# Patient Record
Sex: Female | Born: 1935 | Race: Black or African American | Hispanic: No | Marital: Married | Smoking: Never smoker
Health system: Southern US, Community
[De-identification: ages and names within clinical notes are randomized; demographics above are authoritative.]

## PROBLEM LIST (undated history)

## (undated) DIAGNOSIS — R748 Abnormal levels of other serum enzymes: Secondary | ICD-10-CM

## (undated) DIAGNOSIS — E739 Lactose intolerance, unspecified: Secondary | ICD-10-CM

## (undated) DIAGNOSIS — N6459 Other signs and symptoms in breast: Secondary | ICD-10-CM

## (undated) DIAGNOSIS — I340 Nonrheumatic mitral (valve) insufficiency: Secondary | ICD-10-CM

## (undated) DIAGNOSIS — I10 Essential (primary) hypertension: Secondary | ICD-10-CM

## (undated) DIAGNOSIS — E119 Type 2 diabetes mellitus without complications: Secondary | ICD-10-CM

## (undated) DIAGNOSIS — M1990 Unspecified osteoarthritis, unspecified site: Secondary | ICD-10-CM

## (undated) DIAGNOSIS — I5189 Other ill-defined heart diseases: Secondary | ICD-10-CM

## (undated) DIAGNOSIS — K2970 Gastritis, unspecified, without bleeding: Secondary | ICD-10-CM

## (undated) DIAGNOSIS — R002 Palpitations: Secondary | ICD-10-CM

## (undated) DIAGNOSIS — R252 Cramp and spasm: Secondary | ICD-10-CM

## (undated) DIAGNOSIS — R0609 Other forms of dyspnea: Secondary | ICD-10-CM

## (undated) DIAGNOSIS — K5790 Diverticulosis of intestine, part unspecified, without perforation or abscess without bleeding: Secondary | ICD-10-CM

## (undated) DIAGNOSIS — R0789 Other chest pain: Secondary | ICD-10-CM

## (undated) DIAGNOSIS — E785 Hyperlipidemia, unspecified: Secondary | ICD-10-CM

## (undated) DIAGNOSIS — E7800 Pure hypercholesterolemia, unspecified: Secondary | ICD-10-CM

## (undated) DIAGNOSIS — N952 Postmenopausal atrophic vaginitis: Secondary | ICD-10-CM

## (undated) DIAGNOSIS — D573 Sickle-cell trait: Secondary | ICD-10-CM

## (undated) DIAGNOSIS — M810 Age-related osteoporosis without current pathological fracture: Secondary | ICD-10-CM

## (undated) DIAGNOSIS — K760 Fatty (change of) liver, not elsewhere classified: Secondary | ICD-10-CM

## (undated) DIAGNOSIS — M8580 Other specified disorders of bone density and structure, unspecified site: Secondary | ICD-10-CM

## (undated) DIAGNOSIS — I493 Ventricular premature depolarization: Secondary | ICD-10-CM

## (undated) DIAGNOSIS — IMO0002 Reserved for concepts with insufficient information to code with codable children: Secondary | ICD-10-CM

## (undated) DIAGNOSIS — E559 Vitamin D deficiency, unspecified: Secondary | ICD-10-CM

## (undated) DIAGNOSIS — I471 Supraventricular tachycardia: Secondary | ICD-10-CM

## (undated) DIAGNOSIS — I872 Venous insufficiency (chronic) (peripheral): Secondary | ICD-10-CM

## (undated) DIAGNOSIS — R0602 Shortness of breath: Secondary | ICD-10-CM

## (undated) DIAGNOSIS — R001 Bradycardia, unspecified: Secondary | ICD-10-CM

## (undated) DIAGNOSIS — R42 Dizziness and giddiness: Secondary | ICD-10-CM

## (undated) HISTORY — DX: Bradycardia, unspecified: R00.1

## (undated) HISTORY — DX: Unspecified osteoarthritis, unspecified site: M19.90

## (undated) HISTORY — DX: Lactose intolerance, unspecified: E73.9

## (undated) HISTORY — DX: Nonrheumatic mitral (valve) insufficiency: I34.0

## (undated) HISTORY — DX: Palpitations: R00.2

## (undated) HISTORY — DX: Other forms of dyspnea: R06.09

## (undated) HISTORY — DX: Other signs and symptoms in breast: N64.59

## (undated) HISTORY — DX: Essential (primary) hypertension: I10

## (undated) HISTORY — DX: Age-related osteoporosis without current pathological fracture: M81.0

## (undated) HISTORY — DX: Pure hypercholesterolemia, unspecified: E78.00

## (undated) HISTORY — DX: Sickle-cell trait: D57.3

## (undated) HISTORY — DX: Other ill-defined heart diseases: I51.89

## (undated) HISTORY — PX: COLONOSCOPY: SHX174

## (undated) HISTORY — DX: Shortness of breath: R06.02

## (undated) HISTORY — DX: Diverticulosis of intestine, part unspecified, without perforation or abscess without bleeding: K57.90

## (undated) HISTORY — DX: Hyperlipidemia, unspecified: E78.5

## (undated) HISTORY — DX: Dizziness and giddiness: R42

## (undated) HISTORY — DX: Fatty (change of) liver, not elsewhere classified: K76.0

## (undated) HISTORY — DX: Venous insufficiency (chronic) (peripheral): I87.2

## (undated) HISTORY — DX: Abnormal levels of other serum enzymes: R74.8

## (undated) HISTORY — DX: Supraventricular tachycardia: I47.1

## (undated) HISTORY — DX: Other chest pain: R07.89

## (undated) HISTORY — DX: Reserved for concepts with insufficient information to code with codable children: IMO0002

## (undated) HISTORY — DX: Type 2 diabetes mellitus without complications: E11.9

## (undated) HISTORY — DX: Vitamin D deficiency, unspecified: E55.9

## (undated) HISTORY — DX: Postmenopausal atrophic vaginitis: N95.2

## (undated) HISTORY — DX: Ventricular premature depolarization: I49.3

## (undated) HISTORY — DX: Gastritis, unspecified, without bleeding: K29.70

## (undated) HISTORY — DX: Cramp and spasm: R25.2

## (undated) HISTORY — DX: Other specified disorders of bone density and structure, unspecified site: M85.80

---

## 1980-02-19 HISTORY — PX: TOTAL ABDOMINAL HYSTERECTOMY: SHX209

## 1996-02-19 DIAGNOSIS — IMO0002 Reserved for concepts with insufficient information to code with codable children: Secondary | ICD-10-CM

## 1996-02-19 HISTORY — DX: Reserved for concepts with insufficient information to code with codable children: IMO0002

## 1998-02-03 ENCOUNTER — Other Ambulatory Visit: Admission: RE | Admit: 1998-02-03 | Discharge: 1998-02-03 | Payer: Self-pay | Admitting: Obstetrics and Gynecology

## 1998-02-18 DIAGNOSIS — M8580 Other specified disorders of bone density and structure, unspecified site: Secondary | ICD-10-CM

## 1998-02-18 HISTORY — DX: Other specified disorders of bone density and structure, unspecified site: M85.80

## 1999-02-19 DIAGNOSIS — R748 Abnormal levels of other serum enzymes: Secondary | ICD-10-CM

## 1999-02-19 DIAGNOSIS — K760 Fatty (change of) liver, not elsewhere classified: Secondary | ICD-10-CM

## 1999-02-19 HISTORY — DX: Fatty (change of) liver, not elsewhere classified: K76.0

## 1999-02-19 HISTORY — DX: Abnormal levels of other serum enzymes: R74.8

## 1999-12-14 ENCOUNTER — Encounter: Payer: Self-pay | Admitting: Family Medicine

## 1999-12-14 ENCOUNTER — Encounter: Admission: RE | Admit: 1999-12-14 | Discharge: 1999-12-14 | Payer: Self-pay | Admitting: Family Medicine

## 2000-12-24 ENCOUNTER — Encounter: Payer: Self-pay | Admitting: Family Medicine

## 2000-12-24 ENCOUNTER — Encounter: Admission: RE | Admit: 2000-12-24 | Discharge: 2000-12-24 | Payer: Self-pay | Admitting: Family Medicine

## 2001-02-18 DIAGNOSIS — E7800 Pure hypercholesterolemia, unspecified: Secondary | ICD-10-CM

## 2001-02-18 HISTORY — DX: Pure hypercholesterolemia, unspecified: E78.00

## 2001-05-08 ENCOUNTER — Other Ambulatory Visit: Admission: RE | Admit: 2001-05-08 | Discharge: 2001-05-08 | Payer: Self-pay | Admitting: Obstetrics and Gynecology

## 2002-11-23 ENCOUNTER — Encounter: Payer: Self-pay | Admitting: Internal Medicine

## 2003-02-19 DIAGNOSIS — K5790 Diverticulosis of intestine, part unspecified, without perforation or abscess without bleeding: Secondary | ICD-10-CM

## 2003-02-19 HISTORY — DX: Diverticulosis of intestine, part unspecified, without perforation or abscess without bleeding: K57.90

## 2004-02-19 HISTORY — PX: CHOLECYSTECTOMY: SHX55

## 2004-11-07 ENCOUNTER — Encounter (INDEPENDENT_AMBULATORY_CARE_PROVIDER_SITE_OTHER): Payer: Self-pay | Admitting: Specialist

## 2004-11-07 ENCOUNTER — Ambulatory Visit (HOSPITAL_COMMUNITY): Admission: RE | Admit: 2004-11-07 | Discharge: 2004-11-08 | Payer: Self-pay | Admitting: General Surgery

## 2005-02-18 DIAGNOSIS — E119 Type 2 diabetes mellitus without complications: Secondary | ICD-10-CM

## 2005-02-18 HISTORY — DX: Type 2 diabetes mellitus without complications: E11.9

## 2007-02-19 DIAGNOSIS — I872 Venous insufficiency (chronic) (peripheral): Secondary | ICD-10-CM

## 2007-02-19 HISTORY — DX: Venous insufficiency (chronic) (peripheral): I87.2

## 2008-02-19 DIAGNOSIS — E559 Vitamin D deficiency, unspecified: Secondary | ICD-10-CM

## 2008-02-19 HISTORY — DX: Vitamin D deficiency, unspecified: E55.9

## 2008-12-09 ENCOUNTER — Encounter: Payer: Self-pay | Admitting: Internal Medicine

## 2008-12-22 ENCOUNTER — Ambulatory Visit: Payer: Self-pay

## 2008-12-22 ENCOUNTER — Ambulatory Visit (HOSPITAL_COMMUNITY): Admission: RE | Admit: 2008-12-22 | Discharge: 2008-12-22 | Payer: Self-pay | Admitting: Family Medicine

## 2008-12-22 ENCOUNTER — Ambulatory Visit: Payer: Self-pay | Admitting: Internal Medicine

## 2008-12-22 ENCOUNTER — Encounter (INDEPENDENT_AMBULATORY_CARE_PROVIDER_SITE_OTHER): Payer: Self-pay | Admitting: Family Medicine

## 2009-02-18 DIAGNOSIS — I471 Supraventricular tachycardia, unspecified: Secondary | ICD-10-CM

## 2009-02-18 DIAGNOSIS — I493 Ventricular premature depolarization: Secondary | ICD-10-CM

## 2009-02-18 HISTORY — DX: Supraventricular tachycardia, unspecified: I47.10

## 2009-02-18 HISTORY — DX: Supraventricular tachycardia: I47.1

## 2009-02-18 HISTORY — DX: Ventricular premature depolarization: I49.3

## 2009-05-24 ENCOUNTER — Encounter: Payer: Self-pay | Admitting: Internal Medicine

## 2009-05-30 ENCOUNTER — Encounter: Payer: Self-pay | Admitting: Internal Medicine

## 2009-08-30 ENCOUNTER — Encounter: Payer: Self-pay | Admitting: Internal Medicine

## 2009-08-30 ENCOUNTER — Encounter: Admission: RE | Admit: 2009-08-30 | Discharge: 2009-08-30 | Payer: Self-pay | Admitting: Family Medicine

## 2009-10-16 ENCOUNTER — Ambulatory Visit: Payer: Self-pay | Admitting: Internal Medicine

## 2009-10-16 DIAGNOSIS — R001 Bradycardia, unspecified: Secondary | ICD-10-CM | POA: Insufficient documentation

## 2009-10-16 DIAGNOSIS — R0609 Other forms of dyspnea: Secondary | ICD-10-CM | POA: Insufficient documentation

## 2009-10-16 DIAGNOSIS — R002 Palpitations: Secondary | ICD-10-CM | POA: Insufficient documentation

## 2009-10-16 DIAGNOSIS — I498 Other specified cardiac arrhythmias: Secondary | ICD-10-CM

## 2009-10-16 DIAGNOSIS — R0602 Shortness of breath: Secondary | ICD-10-CM

## 2009-10-19 ENCOUNTER — Telehealth (INDEPENDENT_AMBULATORY_CARE_PROVIDER_SITE_OTHER): Payer: Self-pay | Admitting: *Deleted

## 2009-10-24 ENCOUNTER — Encounter (HOSPITAL_COMMUNITY): Admission: RE | Admit: 2009-10-24 | Discharge: 2009-11-15 | Payer: Self-pay | Admitting: Internal Medicine

## 2009-10-24 ENCOUNTER — Encounter: Payer: Self-pay | Admitting: Internal Medicine

## 2009-10-24 ENCOUNTER — Ambulatory Visit: Payer: Self-pay

## 2009-10-24 ENCOUNTER — Ambulatory Visit: Payer: Self-pay | Admitting: Internal Medicine

## 2009-10-31 ENCOUNTER — Telehealth: Payer: Self-pay | Admitting: Internal Medicine

## 2009-11-27 ENCOUNTER — Ambulatory Visit: Payer: Self-pay | Admitting: Internal Medicine

## 2009-11-27 DIAGNOSIS — I10 Essential (primary) hypertension: Secondary | ICD-10-CM | POA: Insufficient documentation

## 2010-03-11 ENCOUNTER — Encounter: Payer: Self-pay | Admitting: Family Medicine

## 2010-03-22 NOTE — Progress Notes (Signed)
Summary: Nuclear pre procedure  Phone Note Outgoing Call Call back at Home Phone [phone]   Call placed by: Rea College, CMA,  October 19, 2009 2:31 PM Call placed to: Patient Summary of Call: Reviewed information on Myoview Information Sheet (see scanned document for further details).  Spoke with patient.      Nuclear Med Background Indications for Stress Test: Evaluation for Ischemia   History: Echo   Symptoms: Fatigue, Palpitations, SOB    Nuclear Pre-Procedure Cardiac Risk Factors: Hypertension, NIDDM Height (in): 62

## 2010-03-22 NOTE — Assessment & Plan Note (Signed)
Summary: nep   Visit Type:  Initial Consult Referring Provider:  Dr Duaine Dredge Primary Provider:  Dr Duaine Dredge   History of Present Illness: Ms Bardwell is a pleasant 75 yo AAF with a h/o palpitations, HTN, and Diabetes who presents today for Ep consultation regarding symptomatic palpitations.  She reports having symptomatic palpitations for "years".  She describes these as abrupt onset of regular palpitations lasting <1 hour with abrupt termination.   She denies SOB, chest pain, dizziness, presyncope, or syncope with these episodes.  She feelst that these episodes are precipitated by eating sweets.  She is unaware of any maneuvers to terminate episodes.  She has been taking cardizem for 4-5 years but does not feel that this has helped the palpitations. She also reports that over the past few years, she has noticed symptoms of "slow heart beat".  She states that frequently in the morning, she notices that her heart rate is "too slow".  She reports fatigue and SOB with these episodes.  Episodes typically last less than 30 minutes.  She reports that when she is physically active, she developes symptoms of shortness of breath.  She finds that if she stops and rests that these symptoms quickly resolve.  She has also noticed that these episodes occur when walking up stairs.  She denies CP, orthopnea, PND, edema, dizziness, presyncope, or syncope.  Current Medications (verified): 1)  Glipizide Xl 2.5 Mg Xr24h-Tab (Glipizide) .... 1/2 Tablet 2)  K-Tabs 10 Meq Cr-Tabs (Potassium Chloride) .... 2 Tabs Daily 3)  Diltiazem Hcl Er Beads 240 Mg Xr24h-Cap (Diltiazem Hcl Er Beads) .... Once Daily 4)  Chlorthalidone 25 Mg Tabs (Chlorthalidone) .... 1/2 Tablet Daily 5)  Calcium-Vitamin D 600-200 Mg-Unit Tabs (Calcium-Vitamin D) .... 2 Daily 6)  Vitamin D3 1000 Unit Caps (Cholecalciferol) .... Once Daily 7)  Aspirin 81 Mg Tbec (Aspirin) .... Take One Tablet By Mouth Daily 8)  Allegra 180 Mg Tabs (Fexofenadine Hcl)  .... Uad  Allergies (verified): No Known Drug Allergies  Past History:  Past Medical History: Diabetes HTN vertigo sickle cell trait allergic rhinitis lactose intolerance DDD/DJD osteopenia HL diverticulosis fatty liver infiltration 2001  Past Surgical History: cholecystecomty 2006 TAH 1982 for fibroids  Family History: HTN, diabetes and cancer  Social History: Lives in [place] with significant other. 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  Denies T/E/D  Review of Systems       All systems are reviewed and negative except as listed in the HPI.   Vital Signs:  Patient profile:   75 year old female Height:      62 inches Weight:      131 pounds BMI:     24.05 Pulse rate:   64 / minute BP sitting:   130 / 60  (left arm)  Vitals Entered By: Laurance Flatten CMA (October 16, 2009 9:59 AM)  Physical Exam  General:  Well developed, well nourished, in no acute distress. Head:  normocephalic and atraumatic Eyes:  PERRLA/EOM intact; conjunctiva and lids normal. Mouth:  Teeth, gums and palate normal. Oral mucosa normal. Neck:  Neck supple, no JVD. No masses, thyromegaly or abnormal cervical nodes. Lungs:  Clear bilaterally to auscultation and percussion. Heart:  Non-displaced PMI, chest non-tender; regular rate and rhythm, S1, S2 without murmurs, rubs or gallops. Carotid upstroke normal, no bruit. Normal abdominal aortic size, no bruits. Femorals normal pulses, no bruits. Pedals normal pulses. No edema, no varicosities. Abdomen:  Bowel sounds positive; abdomen soft and non-tender without masses, organomegaly, or hernias noted. No hepatosplenomegaly. Msk:  Back normal, normal gait. Muscle strength and tone normal. Pulses:  pulses normal in all 4 extremities Extremities:  No clubbing or cyanosis. Neurologic:  Alert and oriented x 3. Skin:  Intact without lesions or rashes. Cervical Nodes:  no significant  adenopathy Psych:  Normal affect.  Echocardiogram  Procedure date:  10/22/2008  Findings:      Study Conclusions    - Left ventricle: The cavity size was normal. Wall thickness was     normal. Systolic function was normal. The estimated ejection     fraction was in the range of 60% to 65%. Doppler parameters are     consistent with abnormal left ventricular relaxation (grade 1     diastolic dysfunction).   - Mitral valve: Mild regurgitation.   - Pulmonary arteries: PA peak pressure: 31mm Hg (S).  Left atrium                        Peak RV-RA      26 mm Hg  -------   AP dim           35 mm     ------  gradient, S   AP dim index   2.23 cm/m^2 <2.2    Systemic veins                                      Estimated CVP    5 mm Hg  -------                                      Right ventricle                                      Pressure, S     31 mm Hg  <30    Prepared and Electronically Authenticated by    Dietrich Pates, MD   2010-11-04T18:26:45.28    EKG  Procedure date:  10/16/2009  Findings:      sinus rhythm 64 bpm,k PR 160, QTc 435, nonspecific ST/T changes  Impression & Recommendations:  Problem # 1:  SHORTNESS OF BREATH (ICD-786.05)  The patient has intermittent episodes of SOB and fatigue.  She reports SOB with one flight of stairs.  She feels that "slow heart beat" is the cause for her reduced exercise tolerance.  I have reviewed a recent event monitor 7/11 from Dr Duaine Dredge which documents sinus bradycardia and junctional rhythm. At this point, we will obtain a GXT myoview to evaluate her heart rate response during activity and also rule out ischemia for a cause for her SOB.  Further workup pending these results.  Orders: Nuclear Stress Test (Nuc Stress Test)  Problem # 2:  BRADYCARDIA (ICD-427.89)  as above we will decrease cardizem to 120mg  daily, and potentially wean this to off if palpitations allow  Orders: Nuclear Stress Test (Nuc Stress Test)  Problem #  3:  PALPITATIONS, RECURRENT (ICD-785.1)  recent event monitor documents PACs and nonsustained atrial tachycardia as the likely cause of her palpitaitons. Therapeutic strategies for SVT including medicine and ablation were discussed in detail with the patient today. At this  point, the patient and I agree to continue medical therapy. We will evaluate the frequency of her symptoms as we decrease her cardizem.  Pending the results of her stress test, we could consider low dose flecainide as a therapy for her atrial tachycardia in the future if necessary.  Orders: Nuclear Stress Test (Nuc Stress Test)  Patient Instructions: 1)  Decrease Cardizem (diltiazem) to 120mg  one capsule by mouth once daily. 2)  Your physician recommends that you schedule a follow-up appointment in: 6 weeks. 3)  Your physician has requested that you have an exercise stress myoview.  For further information please visit [email or web address]  Please follow instruction sheet, as given. Prescriptions: DILTIAZEM HCL ER BEADS 120 MG XR24H-CAP (DILTIAZEM HCL ER BEADS) Take one capsule by mouth daily  #90 x 3   Entered by:   Sherri Rad, RN, BSN   Authorized by:   Hillis Range, MD   Signed by:   Sherri Rad, RN, BSN on 10/16/2009   Method used:   Faxed to ...       [facility] ([facility]-order)             , [place]         Ph:        Fax: [phone]   RxID:   [id number] DILTIAZEM HCL ER BEADS 120 MG XR24H-CAP (DILTIAZEM HCL ER BEADS) Take one capsule by mouth daily  #30 x 2   Entered by:   Sherri Rad, RN, BSN   Authorized by:   Hillis Range, MD   Signed by:   Sherri Rad, RN, BSN on 10/16/2009   Method used:   Electronically to        [facility]  [address] [phone] (retail)       [address]       [place], [place]  [id number]       Ph: [phone] or [phone]       Fax: [phone]   RxID:   [id number]

## 2010-03-22 NOTE — Assessment & Plan Note (Signed)
Summary: 6wk f/u sl   Visit Type:  Follow-up Referring Provider:  Dr Duaine Dredge Primary Provider:  Dr Duaine Dredge   History of Present Illness: The patient presents today for routine electrophysiology followup. She reports doing very well since last being seen in our clinic. The patient denies symptoms of  chest pain, shortness of breath, orthopnea, PND, lower extremity edema, dizziness, presyncope, syncope, or neurologic sequela.  Her palpitations have improved.  The patient is tolerating medications without difficulties and is otherwise without complaint today.   She reports feeling "much better" on her reduced dose of cardizem.  Current Medications (verified): 1)  Glipizide Xl 2.5 Mg Xr24h-Tab (Glipizide) .... 1/2 Tablet 2)  K-Tabs 10 Meq Cr-Tabs (Potassium Chloride) .... 2 Tabs Daily 3)  Diltiazem Hcl Er Beads 120 Mg Xr24h-Cap (Diltiazem Hcl Er Beads) .... Take One Capsule By Mouth Daily 4)  Chlorthalidone 25 Mg Tabs (Chlorthalidone) .... 1/2 Tablet Daily 5)  Calcium-Vitamin D 600-200 Mg-Unit Tabs (Calcium-Vitamin D) .... 2 Daily 6)  Vitamin D3 1000 Unit Caps (Cholecalciferol) .... Once Daily 7)  Aspirin 81 Mg Tbec (Aspirin) .... Take One Tablet By Mouth Daily 8)  Allegra 180 Mg Tabs (Fexofenadine Hcl) .... Uad  Allergies (verified): No Known Drug Allergies  Past History:  Past Medical History: Reviewed history from 10/16/2009 and no changes required. Diabetes HTN vertigo sickle cell trait allergic rhinitis lactose intolerance DDD/DJD osteopenia HL diverticulosis fatty liver infiltration 2001  Past Surgical History: Reviewed history from 10/16/2009 and no changes required. cholecystecomty 2006 TAH 1982 for fibroids  Social History: Reviewed history from 10/16/2009 and no changes required. Lives in [place] with significant other. 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  Denies T/E/D  Review of  Systems       All systems are reviewed and negative except as listed in the HPI.   Vital Signs:  Patient profile:   75 year old female Height:      62 inches Weight:      130 pounds BMI:     23.86 Pulse rate:   60 / minute BP sitting:   140 / 90  (left arm)  Vitals Entered By: Laurance Flatten CMA (November 27, 2009 9:54 AM)  Physical Exam  General:  Well developed, well nourished, in no acute distress. Head:  normocephalic and atraumatic Eyes:  PERRLA/EOM intact; conjunctiva and lids normal. Mouth:  Teeth, gums and palate normal. Oral mucosa normal. Neck:  Neck supple, no JVD. No masses, thyromegaly or abnormal cervical nodes. Lungs:  clear Heart:  Non-displaced PMI, chest non-tender; regular rate and rhythm, S1, S2 without murmurs, rubs or gallops. Carotid upstroke normal, no bruit. Normal abdominal aortic size, no bruits. Femorals normal pulses, no bruits. Pedals normal pulses. No edema, no varicosities. Abdomen:  Bowel sounds positive; abdomen soft and non-tender without masses, organomegaly, or hernias noted. No hepatosplenomegaly. Msk:  Back normal, normal gait. Muscle strength and tone normal. Pulses:  pulses normal in all 4 extremities Extremities:  No clubbing or cyanosis. Neurologic:  Alert and oriented x 3.  Nuclear Med Study 1 or 2 day study:  1 day     Stress Test Type:  Stress Reading MD:  Arvilla Meres, MD     Referring MD:  Jarold Song Resting Radionuclide:  Technetium 39m Tetrofosmin     Resting Radionuclide Dose:  11 mCi  Stress Radionuclide:  Technetium 29m Tetrofosmin     Stress Radionuclide Dose:  33  mCi   Stress Protocol Exercise Time (min):  6:20 min     Max HR:  141 bpm     Predicted Max HR:  148 bpm  Max Systolic BP: 203 mm Hg     Percent Max HR:  95.27 %     METS: 7.5 Rate Pressure Product:  04540    Stress Test Technologist:  Cathlyn Parsons, RN     Nuclear Technologist:  Domenic Polite, CNMT  Rest Procedure  Myocardial perfusion imaging  was performed at rest 45 minutes following the intravenous administration of Technetium 52m Tetrofosmin.  Stress Procedure  The patient exercised for 6:20.  The patient stopped due to fatigue and SOB.  Patient exertion level was 15. Patient denied any chest pain.  There were positive ST-T wave changes.  Technetium 11m Tetrofosmin was injected at peak exercise and myocardial perfusion imaging was performed after a brief delay.  QPS Raw Data Images:  Normal; no motion artifact; normal heart/lung ratio. Stress Images:  Normal homogeneous uptake in all areas of the myocardium. Rest Images:  Normal homogeneous uptake in all areas of the myocardium. Subtraction (SDS):  Normal Transient Ischemic Dilatation:  1.19  (Normal <1.22)  Lung/Heart Ratio:  0.27  (Normal <0.45)  Quantitative Gated Spect Images QGS EDV:  47 ml QGS ESV:  14 ml QGS EF:  70 % QGS cine images:  Normal  Findings Normal nuclear study Clinically Abnormal (chest pain, ST abnormality, hypotension)      Overall Impression  Exercise Capacity: Fair exercise capacity. BP Response: Hypertensive blood pressure response. Clinical Symptoms: Dyspnea. Fatigue ECG Impression: Significant ST abnormalities consistent with ischemia. Overall Impression: Normal stress nuclear study. Overall Impression Comments: Markedly positive exercise ECG with normal perfusion. Probable false positive exercise ECG.    Signed by Dolores Patty, MD, [facility] on 10/24/2009 at 6:21 PM     Impression & Recommendations:  Problem # 1:  ESSENTIAL HYPERTENSION, BENIGN (ICD-401.1) above goal I would not recommend increasing diltiazem as she appears to be doing much better on our recently decreased dose given diabetes, I have recommended lisinopril She wishes to discuss this first with Dr Duaine Dredge. no changes today  Problem # 2:  PALPITATIONS, RECURRENT (ICD-785.1) improved tolerating recently decreased cardizem  Problem # 3:  BRADYCARDIA  (ICD-427.89) recent GXT reveals adequate heart rate response to exercise no changes  Problem # 4:  SHORTNESS OF BREATH (ICD-786.05) her recent nuclear study was low riks with normal perfusion imaging she states that her shortness of breath has resolved. no further cardiac testing planned at this time  Patient Instructions: 1)  Your physician recommends that you continue on your current medications as directed. Please refer to the Current Medication list given to you today. 2)  Your physician wants you to follow-up in: 6 months  You will receive a reminder letter in the mail two months in advance. If you don't receive a letter, please call our office to schedule the follow-up appointment.

## 2010-03-22 NOTE — Assessment & Plan Note (Signed)
Summary: Cardiology Nuclear Testing  Nuclear Med Background Indications for Stress Test: Evaluation for Ischemia   History: Echo   Symptoms: Fatigue, Palpitations, SOB    Nuclear Pre-Procedure Cardiac Risk Factors: Hypertension, NIDDM Caffeine/Decaff Intake: None NPO After: 5:00 PM Lungs: clear IV 0.9% NS with Angio Cath: 22g     IV Site: (R) Wrist IV Started by: Irean Hong, RN Chest Size (in) 32     Cup Size B     Height (in): 62 Weight (lb): 128 BMI: 23.50 Tech Comments: Held diltiazem 48 hrs.  Nuclear Med Study 1 or 2 day study:  1 day     Stress Test Type:  Stress Reading MD:  Arvilla Meres, MD     Referring MD:  Jarold Song Resting Radionuclide:  Technetium 23m Tetrofosmin     Resting Radionuclide Dose:  11 mCi  Stress Radionuclide:  Technetium 25m Tetrofosmin     Stress Radionuclide Dose:  33 mCi   Stress Protocol Exercise Time (min):  6:20 min     Max HR:  141 bpm     Predicted Max HR:  148 bpm  Max Systolic BP: 203 mm Hg     Percent Max HR:  95.27 %     METS: 7.5 Rate Pressure Product:  54627    Stress Test Technologist:  Cathlyn Parsons, RN     Nuclear Technologist:  Domenic Polite, CNMT  Rest Procedure  Myocardial perfusion imaging was performed at rest 45 minutes following the intravenous administration of Technetium 74m Tetrofosmin.  Stress Procedure  The patient exercised for 6:20.  The patient stopped due to fatigue and SOB.  Patient exertion level was 15. Patient denied any chest pain.  There were positive ST-T wave changes.  Technetium 62m Tetrofosmin was injected at peak exercise and myocardial perfusion imaging was performed after a brief delay.  QPS Raw Data Images:  Normal; no motion artifact; normal heart/lung ratio. Stress Images:  Normal homogeneous uptake in all areas of the myocardium. Rest Images:  Normal homogeneous uptake in all areas of the myocardium. Subtraction (SDS):  Normal Transient Ischemic Dilatation:  1.19  (Normal  <1.22)  Lung/Heart Ratio:  0.27  (Normal <0.45)  Quantitative Gated Spect Images QGS EDV:  47 ml QGS ESV:  14 ml QGS EF:  70 % QGS cine images:  Normal  Findings Normal nuclear study Clinically Abnormal (chest pain, ST abnormality, hypotension)      Overall Impression  Exercise Capacity: Fair exercise capacity. BP Response: Hypertensive blood pressure response. Clinical Symptoms: Dyspnea. Fatigue ECG Impression: Significant ST abnormalities consistent with ischemia. Overall Impression: Normal stress nuclear study. Overall Impression Comments: Markedly positive exercise ECG with normal perfusion. Probable false positive exercise ECG.  Appended Document: Cardiology Nuclear Testing adequate heart rate response to activity. Nuclear imaging felt to be normal Please inform patient. I will discuss further with her upon return.  Appended Document: Cardiology Nuclear Testing pt informed

## 2010-03-22 NOTE — Letter (Signed)
Summary: [facility]  [facility]   Imported By: Marylou Mccoy 11/10/2009 11:24:57  _____________________________________________________________________  External Attachment:    Type:   Image     Comment:   External Document

## 2010-03-22 NOTE — Letter (Signed)
Summary: [facility]   [facility]   Imported By: Marylou Mccoy 11/10/2009 12:28:30  _____________________________________________________________________  External Attachment:    Type:   Image     Comment:   External Document

## 2010-03-22 NOTE — Progress Notes (Signed)
Summary: c/o bp issues ache on shoulder/JA  Phone Note Call from Patient Call back at Home Phone [phone]   Caller: Patient Reason for Call: Talk to Nurse Summary of Call: c/o 164/80 meds decrease 3 weeks by ja. ach on shoulder.  Initial call taken by: Lorne Skeens,  October 31, 2009 12:22 PM  Follow-up for Phone Call        lmfcb. Claris Gladden, RN BSN lmom for pt to call back Dennis Bast, RN, BSN  November 01, 2009 5:29 PM Pt went to see her primary Doctor.  Was told to check her pressure for a few days and keep log and let us know.  She is feeling better now. Will keep her appt of the 11/27/09 Dennis Bast, RN, BSN  November 02, 2009 9:31 AM

## 2010-05-29 ENCOUNTER — Encounter: Payer: Self-pay | Admitting: Internal Medicine

## 2010-06-04 ENCOUNTER — Encounter: Payer: Self-pay | Admitting: Internal Medicine

## 2010-06-04 ENCOUNTER — Ambulatory Visit (INDEPENDENT_AMBULATORY_CARE_PROVIDER_SITE_OTHER): Payer: Medicare Other | Admitting: Internal Medicine

## 2010-06-04 DIAGNOSIS — R002 Palpitations: Secondary | ICD-10-CM

## 2010-06-04 DIAGNOSIS — I10 Essential (primary) hypertension: Secondary | ICD-10-CM

## 2010-06-04 NOTE — Progress Notes (Signed)
The patient presents today for routine electrophysiology followup.  Since last being seen in our clinic, she reports doing very well.  Today, she denies symptoms of  chest pain, shortness of breath, orthopnea, PND, lower extremity edema, dizziness, presyncope, syncope, or neurologic sequela. She remains very active, walking 3 miles per day without ischemic symptoms.  Her palpitations have much improved and she has stopped cardizem. The patient feels that she is tolerating medications without difficulties and is otherwise without complaint today.   Past Medical History  Diagnosis Date  . Essential hypertension, benign   . Shortness of breath   . Hyperlipidemia   . Palpitations     recurrent  . Vertigo   . Sickle cell trait   . Allergic rhinitis   . Lactose intolerance   . DDD (degenerative disc disease)   . DJD (degenerative joint disease)   . Osteopenia   . Diverticulosis   . Fatty infiltration of liver 2001   Past Surgical History  Procedure Date  . Cholecystectomy 2006  . Total abdominal hysterectomy 1982    for fibroids    Current Outpatient Prescriptions  Medication Sig Dispense Refill  . aspirin 81 MG tablet Take 81 mg by mouth daily.        . Calcium Carbonate-Vitamin D (CALCIUM-VITAMIN D) 600-200 MG-UNIT CAPS Take 2 tablets by mouth daily.        . Cholecalciferol 1000 UNITS capsule Take 1,000 Units by mouth daily.        . fexofenadine (ALLEGRA) 180 MG tablet Take 180 mg by mouth daily.        Marland Kitchen glipiZIDE (GLUCOTROL) 2.5 MG 24 hr tablet Take one half tablet daily.       Marland Kitchen losartan (COZAAR) 50 MG tablet Take 25 mg by mouth daily.        Marland Kitchen diltiazem (TIAZAC) 120 MG 24 hr capsule Take 120 mg by mouth daily.        Marland Kitchen DISCONTD: chlorthalidone (HYGROTON) 25 MG tablet Take one half tablet daily       . DISCONTD: potassium chloride (KLOR-CON) 10 MEQ CR tablet Take 10 mEq by mouth 2 (two) times daily.          No Known Allergies  History   Social History  . Marital Status:  Single    Spouse Name: N/A    Number of Children: N/A  . Years of Education: N/A   Occupational History  . retired    Social History Main Topics  . Smoking status: Never Smoker   . Smokeless tobacco: Never Used  . Alcohol Use: No  . Drug Use: No  . Sexually Active: Not on file   Other Topics Concern  . Not on file   Social History Narrative  . No narrative on file    No family history on file.  Physical Exam: Filed Vitals:   06/04/10 0917  BP: 130/70  Pulse: 63  Height: 5\' 1"  (1.549 m)  Weight: 135 lb (61.236 kg)    GEN- The patient is well appearing, alert and oriented x 3 today.   Head- normocephalic, atraumatic Eyes-  Sclera clear, conjunctiva pink Ears- hearing intact Oropharynx- clear Neck- supple, no JVP Lymph- no cervical lymphadenopathy Lungs- Clear to ausculation bilaterally, normal work of breathing Heart- Regular rate and rhythm, no murmurs, rubs or gallops, PMI not laterally displaced GI- soft, NT, ND, + BS Extremities- no clubbing, cyanosis, or edema MS- no significant deformity or atrophy Skin- no rash or lesion Psych-  euthymic mood, full affect Neuro- strength and sensation are intact   EKG- sinus rhythm 63 bpm, nonspecific ST/T changes  Assessment/ Plan

## 2010-06-04 NOTE — Patient Instructions (Signed)
Your physician wants you to follow-up in: 12 months with Dr Allred You will receive a reminder letter in the mail two months in advance. If you don't receive a letter, please call our office to schedule the follow-up appointment.  

## 2010-06-04 NOTE — Assessment & Plan Note (Signed)
Improved No changes today

## 2010-06-04 NOTE — Assessment & Plan Note (Signed)
Much improved She will take cardizem as needed

## 2010-07-06 NOTE — Op Note (Signed)
Diana Hurley, Diana Hurley            ACCOUNT NO.:  [id number]   MEDICAL RECORD NO.:  [id number]          PATIENT TYPE:  AMB   LOCATION:  SDS                          FACILITY:  [facility]   PHYSICIAN:  Sharlet Salina T. Hoxworth, M.D.DATE OF BIRTH:  11/27/1935   DATE OF PROCEDURE:  11/07/2004  DATE OF DISCHARGE:                                 OPERATIVE REPORT   PREOPERATIVE DIAGNOSIS:  Cholelithiasis and cholecystitis.   POSTOPERATIVE DIAGNOSIS:  Cholelithiasis and cholecystitis.   SURGICAL PROCEDURE:  Laparoscopic cholecystectomy with intraoperative  cholangiogram.   SURGEON:  Sharlet Salina T. Hoxworth, M.D.   ANESTHESIA:  General.   BRIEF HISTORY:  Diana Hurley is a 75 year old female who has a history of  episodic epigastric abdominal pain gradually worsening over 2-3 years.  She  had noted that transient elevated LFTs in the past.  She has had a previous  ultrasound showing multiple small gallstones.  She now presents with  persistent nausea of 2 weeks' duration with some associated epigastric  abdominal pain as well.  She had a thorough workup including a CT scan and  endoscopy showing no other cause for symptoms and laparoscopic  cholecystectomy with cholangiogram has been recommend and accepted for  apparent symptomatic cholelithiasis.  The nature of procedure, its  indications, risks of bleeding, infection, bile leak and bile duct injury  were discussed and understood.  The patient is now brought to the operating  room for this procedure.   DESCRIPTION OF OPERATION:  The patient was brought to the operating room and  placed supine position on the operating table and general endotracheal  anesthesia was induced.  The abdomen was widely sterilely prepped and  draped.  PAS hose were in place.  She was given broad-spectrum preoperative  antibiotics.  Correct patient and procedure were verified.  Local anesthesia  was used to infiltrate the trocar sites.  A 1-cm incision was made at the  umbilicus and dissection carried down to the midline fascia, which was  sharply incised for 1 cm and the peritoneum entered under direct vision.  There were some omental adhesions from previous hysterectomy, but these were  bypassed easily with the trocar and there was good visualization of the  upper abdomen.  Under direct vision, a 10-mm trocar was placed in the  subxiphoid area and two 5-mm trocars along the right subcostal margin.  The  gallbladder was not acutely inflamed.  There were some chronic adhesions of  the omentum that were taken down with scissor cautery.  The fundus was  grasped above the liver and then retracted inferolaterally.  Fibrofatty  tissues was stripped off the neck of the gallbladder toward the porta  hepatis and the peritoneum anterior and posterior to Calot's triangle was  dissected.  The cystic artery was clearly seen coursing up on the  gallbladder wall; it was divided between 2 proximal and 1 distal clip.  The  distal gallbladder and cystic duct were then thoroughly dissected and the  cystic duct dissected out over about a centimeter and the cystic duct-  gallbladder junction dissected 360 degrees.  When the anatomy was cleared,  the  cystic duct was clipped at the gallbladder junction and operative  cholangiogram obtained through the cystic duct.  This showed good filling of  a normal common bile duct and intrahepatic ducts with free flow into the  duodenum and no filling defects.  Following this, the cholangiocath was  removed and the cystic duct was triply clipped proximally and divided.  The  gallbladder was then dissected free from its bed using hook cautery and  removed through the umbilicus.  The right upper quadrant was thoroughly  irrigated and suctioned until clear.  Trocars were removed under direct  vision and all CO2 evacuated.  The mattress suture was secured at  the umbilicus.  Skin incisions were closed with interrupted subcuticular 4-0   Monocryl and Steri-Strips.  Sponge, needle and instrument counts were  correct.  Dry sterile dressings were applied and the patient taken to  Recovery in good condition.      Lorne Skeens. Hoxworth, M.D.  Electronically Signed     BTH/MEDQ  D:  11/07/2004  T:  11/07/2004  Job:  [id number]

## 2010-08-28 ENCOUNTER — Ambulatory Visit
Admission: RE | Admit: 2010-08-28 | Discharge: 2010-08-28 | Disposition: A | Discharge status: Home / Self Care | Payer: BC Managed Care – PPO | Source: Ambulatory Visit | Attending: Family Medicine | Admitting: Family Medicine

## 2010-08-28 ENCOUNTER — Other Ambulatory Visit: Payer: Self-pay | Admitting: Family Medicine

## 2010-08-28 DIAGNOSIS — M79659 Pain in unspecified thigh: Secondary | ICD-10-CM

## 2011-02-07 ENCOUNTER — Ambulatory Visit: Payer: Medicare Other | Admitting: *Deleted

## 2011-02-14 ENCOUNTER — Encounter: Payer: Self-pay | Admitting: *Deleted

## 2011-02-14 ENCOUNTER — Encounter: Payer: Medicare Other | Attending: Family Medicine | Admitting: *Deleted

## 2011-02-14 DIAGNOSIS — Z713 Dietary counseling and surveillance: Secondary | ICD-10-CM | POA: Insufficient documentation

## 2011-02-14 DIAGNOSIS — E119 Type 2 diabetes mellitus without complications: Secondary | ICD-10-CM | POA: Insufficient documentation

## 2011-02-14 NOTE — Progress Notes (Signed)
  Medical Nutrition Therapy:  Appt start time: 1000 end time:  1100.   Assessment:  Primary concerns today: Patient states she has had Type 2 Diabetes for a couple of years with no previous diabetes education. She is retired, exercises 6 days/week and has very healthy eating habits. She has not been taught Carb Counting and would like to be educated on that today. She has taken a variety of diabetes medications and states they all call a discomfort in her upper thighs, which has no explanation.   MEDICATIONS: see list   DIETARY INTAKE:  Usual eating pattern includes 3 meals and 0-1 snacks per day.  Everyday foods include good variety of all food groups.  Avoided foods include regular milk due to lactose intolerance and fried foods.    24-hr recall:  B ( AM): fruit and 1/2 muffin, OR egg, 1 slice bread, OR oatmeal and fruit  Snk ( AM): none now  L ( PM): at home; left overs OR unsweetened cereal with 2% Lactaid milk Snk ( PM): occasional fruit or popcorn D ( PM): lean meat, vegetable or 2, usually a starch, whole grain choices Snk ( PM): none or very occasionally small dessert Beverages: hot tea, water only  Usual physical activity: walks 3 miles every AM  Estimated energy needs: 1200 calories 135 g carbohydrates 90 g protein 33 g fat  Progress Towards Goal(s):  In progress.   Nutritional Diagnosis:  NI-5.8.4 Inconsistent carbohydrate intake As related to diabetes .  As evidenced by variable BG .    Intervention:  Nutrition counseling provided concentrating on carb counting and reading food labels. Also discussed basic physiology of diabetes and rationale of consistent carb intake.  Handouts given during visit include:  Living Well With Type 2 Diabetes  Carb Counting and Beyond and Label Reading handouts  Monitoring/Evaluation: Patient plans to call for appointment as needed in the future.

## 2011-02-14 NOTE — Patient Instructions (Signed)
Goals:  Follow Diabetes Meal Plan as instructed  Eat 3 meals and snacks if hungy   Limit carbohydrate intake to 30 grams carbohydrate/meal  Limit carbohydrate intake to 15 grams carbohydrate/snack  Add lean protein foods to meals/snacks  Monitor glucose levels as instructed by your doctor  Aim for 30 mins of physical activity daily

## 2011-06-05 ENCOUNTER — Encounter: Payer: Self-pay | Admitting: Internal Medicine

## 2011-06-05 ENCOUNTER — Ambulatory Visit (INDEPENDENT_AMBULATORY_CARE_PROVIDER_SITE_OTHER): Payer: Medicare Other | Admitting: Internal Medicine

## 2011-06-05 VITALS — BP 133/70 | HR 76 | Resp 18 | Ht 62.0 in | Wt 135.1 lb

## 2011-06-05 DIAGNOSIS — I498 Other specified cardiac arrhythmias: Secondary | ICD-10-CM

## 2011-06-05 DIAGNOSIS — I10 Essential (primary) hypertension: Secondary | ICD-10-CM

## 2011-06-05 DIAGNOSIS — R002 Palpitations: Secondary | ICD-10-CM

## 2011-06-05 NOTE — Assessment & Plan Note (Signed)
resolved 

## 2011-06-05 NOTE — Assessment & Plan Note (Signed)
She likely has sinus node dysfunction with mild symptoms with her bradycardia.  She is clear that she would like to avoid a PPM at this time.  As she has not had presyncope or syncope, I think that this is reasonable.  She does not wish to wear a 24 hour holter now.  She will contact me if her symptoms worsen.  I suspect that she may eventually require pacemaker implantation.

## 2011-06-05 NOTE — Assessment & Plan Note (Signed)
Stable No change required today  

## 2011-06-05 NOTE — Patient Instructions (Signed)
Your physician recommends that you continue on your current medications as directed. Please refer to the Current Medication list given to you today.  Your physician wants you to follow-up in: 1 year with Dr. Allred.  You will receive a reminder letter in the mail two months in advance. If you don't receive a letter, please call our office to schedule the follow-up appointment.  

## 2011-06-05 NOTE — Progress Notes (Signed)
PCP:  Dr Duaine Dredge  The patient presents today for routine electrophysiology followup.  Since last being seen in our clinic, she reports doing very well.  Her palpitations have resolved.  She does have periods where she feels that her heart is "too slow".  She reports fatigue and mild SOB during these episodes.  She denies chest pain, shortness of breath,lower extremity edema, dizziness, presyncope, syncope, or neurologic sequela.   The patient feels that she is tolerating medications without difficulties and is otherwise without complaint today.   Past Medical History  Diagnosis Date  . Essential hypertension, benign   . Shortness of breath   . Hyperlipidemia   . Palpitations     recurrent  . Vertigo   . Sickle cell trait   . Allergic rhinitis   . Lactose intolerance   . DDD (degenerative disc disease)   . DJD (degenerative joint disease)   . Osteopenia   . Diverticulosis   . Fatty infiltration of liver 2001  . Bradycardia    Past Surgical History  Procedure Date  . Cholecystectomy 2006  . Total abdominal hysterectomy 1982    for fibroids    Current Outpatient Prescriptions  Medication Sig Dispense Refill  . acarbose (PRECOSE) 25 MG tablet Take 25 mg by mouth 2 (two) times daily before a meal.      . aspirin 81 MG tablet Take 81 mg by mouth daily.        . Calcium Carbonate-Vitamin D (CALCIUM-VITAMIN D) 600-200 MG-UNIT CAPS Take 2 tablets by mouth daily.        . fexofenadine (ALLEGRA) 180 MG tablet Take 180 mg by mouth daily.        Marland Kitchen losartan (COZAAR) 50 MG tablet Take 100 mg by mouth daily.       . NON FORMULARY DRINKS YODER'S VINEGAR DAILY .      Marland Kitchen PATADAY 0.2 % SOLN 1 drop.       . Cholecalciferol 1000 UNITS capsule Take 1,000 Units by mouth daily.          Allergies  Allergen Reactions  . Food     Lactose intolerant  . Glipizide   . Januvia (Sitagliptin Phosphate)   . Metformin And Related     History   Social History  . Marital Status: Married    Spouse  Name: N/A    Number of Children: N/A  . Years of Education: N/A   Occupational History  . retired    Social History Main Topics  . Smoking status: Never Smoker   . Smokeless tobacco: Never Used  . Alcohol Use: No  . Drug Use: No  . Sexually Active: Not on file   Other Topics Concern  . Not on file   Social History Narrative  . No narrative on file    No family history on file.  Physical Exam: Filed Vitals:   06/05/11 1012  BP: 133/70  Pulse: 76  Resp: 18  Height: 5\' 2"  (1.575 m)  Weight: 135 lb 1.9 oz (61.29 kg)    GEN- The patient is well appearing, alert and oriented x 3 today.   Head- normocephalic, atraumatic Eyes-  Sclera clear, conjunctiva pink Ears- hearing intact Oropharynx- clear Neck- supple, no JVP Lymph- no cervical lymphadenopathy Lungs- Clear to ausculation bilaterally, normal work of breathing Heart- bradycardic regular rhythm, no murmurs, rubs or gallops, PMI not laterally displaced GI- soft, NT, ND, + BS Extremities- no clubbing, cyanosis, or edema MS- no significant deformity or  atrophy Skin- no rash or lesion Psych- euthymic mood, full affect Neuro- strength and sensation are intact   EKG- sinus arrhythmia, average V rate 63 bpm,nonspecific t wave flattening  Assessment/ Plan

## 2012-03-01 DIAGNOSIS — R0600 Dyspnea, unspecified: Secondary | ICD-10-CM

## 2012-03-01 DIAGNOSIS — R0609 Other forms of dyspnea: Secondary | ICD-10-CM

## 2012-03-01 HISTORY — DX: Dyspnea, unspecified: R06.00

## 2012-03-01 HISTORY — DX: Other forms of dyspnea: R06.09

## 2012-07-08 ENCOUNTER — Ambulatory Visit (INDEPENDENT_AMBULATORY_CARE_PROVIDER_SITE_OTHER): Payer: Medicare Other | Admitting: Internal Medicine

## 2012-07-08 ENCOUNTER — Encounter: Payer: Self-pay | Admitting: Internal Medicine

## 2012-07-08 VITALS — BP 139/71 | HR 71 | Ht 61.0 in | Wt 128.4 lb

## 2012-07-08 DIAGNOSIS — I498 Other specified cardiac arrhythmias: Secondary | ICD-10-CM

## 2012-07-08 DIAGNOSIS — I10 Essential (primary) hypertension: Secondary | ICD-10-CM

## 2012-07-08 DIAGNOSIS — I495 Sick sinus syndrome: Secondary | ICD-10-CM

## 2012-07-08 NOTE — Patient Instructions (Signed)
Your physician wants you to follow-up in: 12 months with Dr Allred You will receive a reminder letter in the mail two months in advance. If you don't receive a letter, please call our office to schedule the follow-up appointment.  

## 2012-07-08 NOTE — Progress Notes (Signed)
PCP:  Dr Duaine Dredge  The patient presents today for routine electrophysiology followup.  Since last being seen in our clinic, she reports doing very well.  Her palpitations have resolved. Her exercise tolerance is mostly preserved and her energy is good.  She has occasional SOB with ambulation of more than 1 flight of stares.  This is stable.  She denies chest pain, shortness of breath,lower extremity edema, dizziness, presyncope, syncope, or neurologic sequela.   The patient feels that she is tolerating medications without difficulties and is otherwise without complaint today.   Past Medical History  Diagnosis Date  . Essential hypertension, benign   . Shortness of breath   . Hyperlipidemia   . Palpitations     recurrent  . Vertigo   . Sickle cell trait   . Allergic rhinitis   . Lactose intolerance   . DDD (degenerative disc disease)   . DJD (degenerative joint disease)   . Osteopenia   . Diverticulosis   . Fatty infiltration of liver 2001  . Bradycardia    Past Surgical History  Procedure Laterality Date  . Cholecystectomy  2006  . Total abdominal hysterectomy  1982    for fibroids    Current Outpatient Prescriptions  Medication Sig Dispense Refill  . aspirin 81 MG tablet Take 81 mg by mouth daily.        . Calcium Carbonate-Vitamin D (CALCIUM-VITAMIN D) 600-200 MG-UNIT CAPS Take 2 tablets by mouth daily.        . Cholecalciferol 1000 UNITS capsule Take 1,000 Units by mouth daily.        . fexofenadine (ALLEGRA) 180 MG tablet Take 180 mg by mouth daily.        Marland Kitchen losartan (COZAAR) 50 MG tablet Take 100 mg by mouth daily.       Marland Kitchen PATADAY 0.2 % SOLN 1 drop.       . chlorthalidone (HYGROTON) 25 MG tablet       . cloNIDine (CATAPRES) 0.1 MG tablet       . metFORMIN (GLUCOPHAGE-XR) 500 MG 24 hr tablet       . pravastatin (PRAVACHOL) 10 MG tablet        No current facility-administered medications for this visit.    Allergies  Allergen Reactions  . Food     Lactose intolerant   . Glipizide   . Januvia (Sitagliptin Phosphate)   . Metformin And Related     History   Social History  . Marital Status: Married    Spouse Name: N/A    Number of Children: N/A  . Years of Education: N/A   Occupational History  . retired    Social History Main Topics  . Smoking status: Never Smoker   . Smokeless tobacco: Never Used  . Alcohol Use: No  . Drug Use: No  . Sexually Active: Not on file   Other Topics Concern  . Not on file   Social History Narrative  . No narrative on file    No family history on file.  Physical Exam: Filed Vitals:   07/08/12 1356  BP: 139/71  Pulse: 71  Height: 5\' 1"  (1.549 m)  Weight: 128 lb 6.4 oz (58.242 kg)    GEN- The patient is well appearing, alert and oriented x 3 today.   Head- normocephalic, atraumatic Eyes-  Sclera clear, conjunctiva pink Ears- hearing intact Oropharynx- clear Neck- supple, no JVP Lymph- no cervical lymphadenopathy Lungs- Clear to ausculation bilaterally, normal work of breathing Heart- bradycardic  regular rhythm, no murmurs, rubs or gallops, PMI not laterally displaced GI- soft, NT, ND, + BS Extremities- no clubbing, cyanosis, or edema MS- no significant deformity or atrophy Skin- no rash or lesion Psych- euthymic mood, full affect Neuro- strength and sensation are intact   EKG- sinus rhythm 69 bpm,nonspecific t wave flattening  Assessment/ Plan

## 2012-07-08 NOTE — Assessment & Plan Note (Signed)
Stable No change required today  

## 2012-07-08 NOTE — Assessment & Plan Note (Signed)
Asymptomatic No further workup planned No indication for pacing at this time

## 2013-03-02 ENCOUNTER — Ambulatory Visit
Admission: RE | Admit: 2013-03-02 | Discharge: 2013-03-02 | Disposition: A | Discharge status: Home / Self Care | Payer: Medicare Other | Source: Ambulatory Visit | Attending: Family Medicine | Admitting: Family Medicine

## 2013-03-02 ENCOUNTER — Other Ambulatory Visit: Payer: Self-pay | Admitting: Family Medicine

## 2013-03-02 DIAGNOSIS — R0609 Other forms of dyspnea: Principal | ICD-10-CM

## 2013-03-11 ENCOUNTER — Encounter: Payer: Self-pay | Admitting: Internal Medicine

## 2013-03-11 ENCOUNTER — Ambulatory Visit (INDEPENDENT_AMBULATORY_CARE_PROVIDER_SITE_OTHER): Payer: Medicare Other | Admitting: Internal Medicine

## 2013-03-11 VITALS — BP 150/80 | HR 65 | Ht 61.0 in | Wt 132.0 lb

## 2013-03-11 DIAGNOSIS — R0602 Shortness of breath: Secondary | ICD-10-CM

## 2013-03-11 DIAGNOSIS — I498 Other specified cardiac arrhythmias: Secondary | ICD-10-CM

## 2013-03-11 DIAGNOSIS — I10 Essential (primary) hypertension: Secondary | ICD-10-CM

## 2013-03-11 DIAGNOSIS — R002 Palpitations: Secondary | ICD-10-CM

## 2013-03-11 NOTE — Patient Instructions (Addendum)
Your physician recommends that you schedule a follow-up appointment in: 3 weeks with [place] physician has requested that you have an echocardiogram. Echocardiography is a painless test that uses sound waves to create images of your heart. It provides your doctor with information about the size and shape of your heart and how well your heart's chambers and valves are working. This procedure takes approximately one hour. There are no restrictions for this procedure.   Your physician has requested that you have en exercise stress myoview. For further information please visit [email or web address]. Please follow instruction sheet, as given.

## 2013-03-11 NOTE — Progress Notes (Signed)
PCP:  Dr Sandi Mariscal  The patient presents today for cardiology followup.  Though her palpitations have resolved, she has developed recent worsening SOB.  She finds that when walking up stairs or an incline that she is dyspneic.  She has occasional SOB at rest.  She feels that this represents interval worsening since when I saw her last.  She denies chest pain, lower extremity edema, dizziness, presyncope, syncope, or neurologic sequela.   The patient feels that she is tolerating medications without difficulties and is otherwise without complaint today.   Past Medical History  Diagnosis Date  . Essential hypertension, benign   . Shortness of breath   . Hyperlipidemia   . Palpitations     recurrent  . Vertigo   . Sickle cell trait   . Allergic rhinitis   . Lactose intolerance   . DDD (degenerative disc disease) 1998    Exacerbation in 2004 w/right radicular Sx  . DJD (degenerative joint disease)   . Osteopenia 2000  . Diverticulosis 2005    Found during colonoscopy  . Fatty infiltration of liver 2001  . Bradycardia   . DOE (dyspnea on exertion) 03/01/12    Sx have increased over past several months, referral to Thompson Grayer  . Muscle cramps     In hands and thighs  . PVC's (premature ventricular contractions) 2011  . PSVT (paroxysmal supraventricular tachycardia) 2011  . Hypertension   . Elevated liver enzymes 2001    Secondary to a gallstone & fatty filtration of the liver  . Hypercholesterolemia 2003  . Diabetes mellitus, type 2 2007  . Venous insufficiency 2009  . Vitamin D deficiency 2010  . Atrophic vaginitis   . Diastolic dysfunction     GRADE 1   . Mitral regurgitation   . Musculoskeletal chest pain   . Breast thickening     left   Past Surgical History  Procedure Laterality Date  . Cholecystectomy  2006  . Total abdominal hysterectomy  1982    for fibroids    Current Outpatient Prescriptions  Medication Sig Dispense Refill  . amLODipine (NORVASC) 2.5 MG tablet  Take 1 tablet by mouth daily.      Marland Kitchen aspirin 81 MG tablet Take 81 mg by mouth daily.        . Calcium Carbonate-Vitamin D (CALCIUM-VITAMIN D) 600-200 MG-UNIT CAPS Take 2 tablets by mouth daily.        . Cholecalciferol 1000 UNITS capsule Take 1,000 Units by mouth daily.        . fexofenadine (ALLEGRA) 180 MG tablet Take 180 mg by mouth daily as needed.       Marland Kitchen losartan (COZAAR) 50 MG tablet Take 100 mg by mouth daily.       . metFORMIN (GLUCOPHAGE-XR) 500 MG 24 hr tablet Take 1 tablet in the morning and 1/2 tablet in the evening      . Multiple Vitamins-Minerals (MULTIVITAL) tablet Take 1 tablet by mouth daily. Centrum Silver      . PATADAY 0.2 % SOLN Apply 1 drop to eye as needed.        No current facility-administered medications for this visit.    Allergies  Allergen Reactions  . Penicillins Other (See Comments)    syncope  . Acarbose Other (See Comments)    Bloating and gas  . Ace Inhibitors Other (See Comments)    angioedema  . Food     Lactose intolerant  . Glipizide Other (See Comments)    hypoglycemia  .  Januvia [Sitagliptin Phosphate] Other (See Comments)    myalgias  . Lovastatin Other (See Comments)    myalgias  . Metformin And Related   . Simvastatin Other (See Comments)    myalgias    History   Social History  . Marital Status: Married    Spouse Name: N/A    Number of Children: N/A  . Years of Education: N/A   Occupational History  . retired    Social History Main Topics  . Smoking status: Never Smoker   . Smokeless tobacco: Never Used  . Alcohol Use: 0.0 oz/week     Comment: Rare. Drinks about 1 glass a wine a month  . Drug Use: No  . Sexual Activity: Not on file   Other Topics Concern  . Not on file   Social History Narrative  . No narrative on file    Family History  Problem Relation Age of Onset  . Kidney failure Mother     renal failure secondary to diabetes  . Diabetes Mother   . Cancer Mother     breast  . Cancer Father      liver  . Hypertension Sister   . Hyperlipidemia Brother   . Hypertension Sister   . CAD Sister   . Hypertension Sister   . Diabetes Sister   . Hypertension Sister     Physical Exam: Filed Vitals:   03/11/13 1509  BP: 150/80  Pulse: 65  Height: 5\' 1"  (1.549 m)  Weight: 132 lb (59.875 kg)    GEN- The patient is well appearing, alert and oriented x 3 today.   Head- normocephalic, atraumatic Eyes-  Sclera clear, conjunctiva pink Ears- hearing intact Oropharynx- clear Neck- supple, no JVP Lymph- no cervical lymphadenopathy Lungs- Clear to ausculation bilaterally, normal work of breathing Heart- bradycardic regular rhythm, no murmurs, rubs or gallops, PMI not laterally displaced GI- soft, NT, ND, + BS Extremities- no clubbing, cyanosis, or edema Neuro- strength and sensation are intact  EKG- sinus rhythm 65 bpm, septal infarction with nonspecific t wave flattening  Assessment/ Plan  1. SOB The patient reports progressive SOB and decreased exercise tolerance.  Though this may represent deconditioning, she has multiple CAD risk factors and I am concerned that this could represent an ischemic equivalent.  I would therefore proceed with GXT myoview at this time.  I think that myoview imaging is necessary given her baseline ekg abnormality.  I will also obtain an echo to evaluate for structural changes. She will return to follow-up with Richardson Dopp in 3 weeks  2. Bradycardia Resolved  3. Palpitations Improved  4. HTN Stable No change required today  Follow-up with Richardson Dopp in 3 weeks If above workup is unrevealing, I will see in a year

## 2013-03-15 ENCOUNTER — Encounter: Payer: Self-pay | Admitting: Cardiology

## 2013-03-15 ENCOUNTER — Ambulatory Visit (HOSPITAL_COMMUNITY): Payer: Medicare Other | Attending: Cardiology | Admitting: Cardiology

## 2013-03-15 DIAGNOSIS — R0989 Other specified symptoms and signs involving the circulatory and respiratory systems: Principal | ICD-10-CM | POA: Insufficient documentation

## 2013-03-15 DIAGNOSIS — R002 Palpitations: Secondary | ICD-10-CM

## 2013-03-15 DIAGNOSIS — R0609 Other forms of dyspnea: Secondary | ICD-10-CM | POA: Insufficient documentation

## 2013-03-15 DIAGNOSIS — R0602 Shortness of breath: Secondary | ICD-10-CM

## 2013-03-15 DIAGNOSIS — E785 Hyperlipidemia, unspecified: Secondary | ICD-10-CM | POA: Insufficient documentation

## 2013-03-15 DIAGNOSIS — I10 Essential (primary) hypertension: Secondary | ICD-10-CM | POA: Insufficient documentation

## 2013-03-15 DIAGNOSIS — I059 Rheumatic mitral valve disease, unspecified: Secondary | ICD-10-CM | POA: Insufficient documentation

## 2013-03-15 NOTE — Progress Notes (Signed)
Echo performed. 

## 2013-03-19 ENCOUNTER — Encounter: Payer: Self-pay | Admitting: Internal Medicine

## 2013-03-23 ENCOUNTER — Ambulatory Visit (HOSPITAL_COMMUNITY): Payer: Medicare Other | Attending: Cardiology | Admitting: Radiology

## 2013-03-23 ENCOUNTER — Ambulatory Visit: Payer: Medicare Other | Admitting: Physician Assistant

## 2013-03-23 VITALS — BP 162/84 | HR 59 | Ht 61.0 in | Wt 134.0 lb

## 2013-03-23 DIAGNOSIS — I10 Essential (primary) hypertension: Secondary | ICD-10-CM | POA: Insufficient documentation

## 2013-03-23 DIAGNOSIS — R0989 Other specified symptoms and signs involving the circulatory and respiratory systems: Principal | ICD-10-CM | POA: Insufficient documentation

## 2013-03-23 DIAGNOSIS — E119 Type 2 diabetes mellitus without complications: Secondary | ICD-10-CM | POA: Insufficient documentation

## 2013-03-23 DIAGNOSIS — R0609 Other forms of dyspnea: Secondary | ICD-10-CM | POA: Insufficient documentation

## 2013-03-23 DIAGNOSIS — R0602 Shortness of breath: Secondary | ICD-10-CM | POA: Insufficient documentation

## 2013-03-23 DIAGNOSIS — Z8249 Family history of ischemic heart disease and other diseases of the circulatory system: Secondary | ICD-10-CM | POA: Insufficient documentation

## 2013-03-23 DIAGNOSIS — E785 Hyperlipidemia, unspecified: Secondary | ICD-10-CM | POA: Insufficient documentation

## 2013-03-23 MED ORDER — TECHNETIUM TC 99M SESTAMIBI GENERIC - CARDIOLITE
30.0000 | Freq: Once | INTRAVENOUS | Status: AC | PRN
  Administered 2013-03-23: 30 via INTRAVENOUS

## 2013-03-23 MED ORDER — TECHNETIUM TC 99M SESTAMIBI GENERIC - CARDIOLITE
10.0000 | Freq: Once | INTRAVENOUS | Status: AC | PRN
  Administered 2013-03-23: 10 via INTRAVENOUS

## 2013-03-23 NOTE — Progress Notes (Signed)
[facility] [address] [place], [place] [address] [phone]    Cardiology Nuclear Med Study  Diana Hurley is a 78 y.o. female     MRN : [id number]     DOB: 02/22/1935  Procedure Date: 03/23/2013  Nuclear Med Background Indication for Stress Test:  Evaluation for Ischemia History:  No known CAD, Echo 2015 EF 65-70%, MPI 2011 (normal) EF 70% Cardiac Risk Factors: Family History - CAD, Hypertension, Lipids and NIDDM  Symptoms:  DOE and SOB   Nuclear Pre-Procedure Caffeine/Decaff Intake:  None >12 hrs NPO After: 7:30am   Lungs:  clear O2 Sat: 98% on room air. IV 0.9% NS with Angio Cath:  22g  IV Site: L Antecubital x 1, tolerated well IV Started by:  Irven Baltimore, RN  Chest Size (in):  32 Cup Size: B  Height: 5\' 1"  (1.549 m)  Weight:  134 lb (60.782 kg)  BMI:  Body mass index is 25.33 kg/(m^2). Tech Comments:  Held Metformin today    Nuclear Med Study 1 or 2 day study: 1 day  Stress Test Type:  Stress  Reading MD: N/A  Order Authorizing Provider:  Thompson Grayer, MD  Resting Radionuclide: Technetium 61m Sestamibi  Resting Radionuclide Dose: 11.0 mCi   Stress Radionuclide:  Technetium 47m Sestamibi  Stress Radionuclide Dose: 33.0 mCi           Stress Protocol Rest HR: 59 Stress HR: 133  Rest BP: 162/84 Stress BP: 195/69  Exercise Time (min): 7:30 METS: 9.3           Dose of Adenosine (mg):  n/a Dose of Lexiscan: n/a mg  Dose of Atropine (mg): n/a Dose of Dobutamine: n/a mcg/kg/min (at max HR)  Stress Test Technologist: Glade Lloyd, BS-ES  Nuclear Technologist:  Charlton Amor, CNMT     Rest Procedure:  Myocardial perfusion imaging was performed at rest 45 minutes following the intravenous administration of Technetium 57m Sestamibi. Rest ECG: NSR - Normal EKG  Stress Procedure:  The patient exercised on the treadmill utilizing the Bruce Protocol for 7:30 minutes. The patient stopped due to fatigue and denied any chest pain.   Technetium 44m Sestamibi was injected at peak exercise and myocardial perfusion imaging was performed after a brief delay. Stress ECG: No significant change from baseline ECG  QPS Raw Data Images:  Normal; no motion artifact; normal heart/lung ratio. Stress Images:  Normal homogeneous uptake in all areas of the myocardium. Rest Images:  Normal homogeneous uptake in all areas of the myocardium. Subtraction (SDS):  No evidence of ischemia. Transient Ischemic Dilatation (Normal <1.22):  1.00 Lung/Heart Ratio (Normal <0.45):  0.16  Quantitative Gated Spect Images QGS EDV:  49 ml QGS ESV:  15 ml  Impression Exercise Capacity:  Excellent exercise capacity. BP Response:  Hypertensive blood pressure response. Clinical Symptoms:  No significant symptoms noted. ECG Impression:  No significant ST segment change suggestive of ischemia. Comparison with Prior Nuclear Study: No previous nuclear study performed  Overall Impression:  Normal stress nuclear study.  LV Ejection Fraction: 70%.  LV Wall Motion:  NL LV Function; NL Wall Motion  Diana Hurley, H

## 2013-03-26 ENCOUNTER — Ambulatory Visit (INDEPENDENT_AMBULATORY_CARE_PROVIDER_SITE_OTHER): Payer: Medicare Other | Admitting: Physician Assistant

## 2013-03-26 ENCOUNTER — Encounter: Payer: Self-pay | Admitting: Physician Assistant

## 2013-03-26 ENCOUNTER — Ambulatory Visit: Payer: Medicare Other | Admitting: Physician Assistant

## 2013-03-26 VITALS — BP 188/63 | HR 60 | Ht 61.0 in | Wt 133.0 lb

## 2013-03-26 DIAGNOSIS — I10 Essential (primary) hypertension: Secondary | ICD-10-CM

## 2013-03-26 DIAGNOSIS — R002 Palpitations: Secondary | ICD-10-CM

## 2013-03-26 DIAGNOSIS — R0602 Shortness of breath: Secondary | ICD-10-CM

## 2013-03-26 NOTE — Patient Instructions (Signed)
Your physician recommends that you schedule a follow-up appointment in: 1 year with Dr. Thompson Grayer. Schedule a follow up with Marylene Land, MD for further workup of your shortness of breath.

## 2013-03-26 NOTE — Progress Notes (Signed)
[address] [place], [place]  [address] Phone: [phone] Fax:  [phone]  Date:  03/26/2013   ID:  Diana Hurley, Diana Hurley 1935-12-14, MRN [id number]  PCP:  Marylene Land, MD  Cardiologist:  Dr. Thompson Grayer     History of Present Illness: Diana Hurley is a 78 y.o. female with a hx of palpitations, HTN, T2DM, HL.  She was recently seen in f/u by Dr. Thompson Grayer and noted increased DOE.  She was set up for stress testing and an echocardiogram.  Echo shows vigorous LVF with normal EF and myoview was negative for ischemia with good exercise tolerance.  She returns for follow up.    Echo (03/15/13):  Vigorous LVF, EF 65-70%, no WMA, Gr 1 DD, mild MR, mild increased PASP.   ETT-Myoview (03/23/13):  Ex 7:30, no ECG changes; no ischemia, EF 70% (normal).    She notes her dyspnea is mainly in the AM if she sits for a while and gets up to do something (steps, etc).  She goes to [employer] aer[employer] and denies any exertional dyspnea with exercise.  Denies chest pain.  She does have allergies.  No wheezing.  No significant cough.  No orthopnea, PND, edema.  Recent Labs: No results found for requested labs within last 365 days.  Wt Readings from Last 3 Encounters:  03/26/13 133 lb (60.328 kg)  03/23/13 134 lb (60.782 kg)  03/11/13 132 lb (59.875 kg)     Past Medical History  Diagnosis Date  . Essential hypertension, benign   . Shortness of breath   . Hyperlipidemia   . Palpitations     recurrent  . Vertigo   . Sickle cell trait   . Allergic rhinitis   . Lactose intolerance   . DDD (degenerative disc disease) 1998    Exacerbation in 2004 w/right radicular Sx  . DJD (degenerative joint disease)   . Osteopenia 2000  . Diverticulosis 2005    Found during colonoscopy  . Fatty infiltration of liver 2001  . Bradycardia   . DOE (dyspnea on exertion) 03/01/12    Sx have increased over past several months, referral to Thompson Grayer  . Muscle cramps     In hands and thighs    . PVC's (premature ventricular contractions) 2011  . PSVT (paroxysmal supraventricular tachycardia) 2011  . Hypertension   . Elevated liver enzymes 2001    Secondary to a gallstone & fatty filtration of the liver  . Hypercholesterolemia 2003  . Diabetes mellitus, type 2 2007  . Venous insufficiency 2009  . Vitamin D deficiency 2010  . Atrophic vaginitis   . Diastolic dysfunction     GRADE 1   . Mitral regurgitation   . Musculoskeletal chest pain   . Breast thickening     left    Current Outpatient Prescriptions  Medication Sig Dispense Refill  . amLODipine (NORVASC) 2.5 MG tablet Take 1 tablet by mouth daily.      Marland Kitchen aspirin 81 MG tablet Take 81 mg by mouth daily.        . Calcium Carbonate-Vitamin D (CALCIUM-VITAMIN D) 600-200 MG-UNIT CAPS Take 2 tablets by mouth daily.        . Cholecalciferol 1000 UNITS capsule Take 1,000 Units by mouth daily.        . fexofenadine (ALLEGRA) 180 MG tablet Take 180 mg by mouth daily as needed.       Marland Kitchen losartan (COZAAR) 50 MG tablet Take 100 mg by  mouth daily.       . metFORMIN (GLUCOPHAGE-XR) 500 MG 24 hr tablet Take 1 tablet in the morning and 1/2 tablet in the evening      . Multiple Vitamins-Minerals (MULTIVITAL) tablet Take 1 tablet by mouth daily. Centrum Silver      . PATADAY 0.2 % SOLN Apply 1 drop to eye as needed.        No current facility-administered medications for this visit.    Allergies:   Penicillins; Acarbose; Ace inhibitors; Food; Glipizide; Januvia; Lovastatin; Metformin and related; and Simvastatin   Social History:  The patient  reports that she has never smoked. She has never used smokeless tobacco. She reports that she drinks alcohol. She reports that she does not use illicit drugs.   Family History:  The patient's family history includes CAD in her sister; Cancer in her father and mother; Diabetes in her mother and sister; Hyperlipidemia in her brother; Hypertension in her sister, sister, sister, and sister; Kidney  failure in her mother.   ROS:  Please see the history of present illness.      All other systems reviewed and negative.   PHYSICAL EXAM: VS:  BP 188/63  Pulse 60  Ht 5\' 1"  (1.549 m)  Wt 133 lb (60.328 kg)  BMI 25.14 kg/m2 Well nourished, well developed, in no acute distress HEENT: normal Neck: no JVD Cardiac:  normal S1, S2; RRR; no murmur Lungs:  clear to auscultation bilaterally, no wheezing, rhonchi or rales Abd: soft, nontender, no hepatomegaly Ext: no edema Skin: warm and dry Neuro:  CNs 2-12 intact, no focal abnormalities noted  EKG:  NSR, HR 60, NSSTTW changes     ASSESSMENT AND PLAN:  1. Dyspnea:  Normal echo and normal myoview.  No further cardiac workup.  Question if she has bronchospasm.  PFTs may be worthwhile.  I advised her to f/u with PCP to investigate further. 2. Palpitations:  Controlled.  3. Hypertension:  She has "white coat HTN."  BPs at home are optimal.  4. Disposition:  F/u with Dr. Thompson Grayer in 1 year.  Signed, Richardson Dopp, PA-C  03/26/2013 11:05 AM

## 2013-05-20 ENCOUNTER — Telehealth: Payer: Self-pay | Admitting: Nurse Practitioner

## 2013-05-20 NOTE — Telephone Encounter (Signed)
Spoke with patient. Patient has been experiencing some burning, itching, and dryness that started around a week ago. Denies bloating, abdominal pain, bleeding, discharge, and fevers. States "I am trying to make sure I do everything right by drinking water and staying clean but it does not seem to be working." States she has been prescribed Premarin before and it has helped. Would like to come in for office visit with Milford Cage, FNP. Requesting 4/7 appointment. Appointment made for 11:15 on 4/7 with Milford Cage, FNP. Patient has not been seen since 2013 advise will need to schedule annual after problem visit.  Routing to provider for final review. Patient agreeable to disposition. Will close encounter

## 2013-05-20 NOTE — Telephone Encounter (Signed)
Patient said she is having dryness and itching for about a week. hasnt been since since 2013

## 2013-05-24 ENCOUNTER — Encounter: Payer: Self-pay | Admitting: *Deleted

## 2013-05-25 ENCOUNTER — Ambulatory Visit
Admission: RE | Admit: 2013-05-25 | Discharge: 2013-05-25 | Disposition: A | Discharge status: Home / Self Care | Payer: Medicare Other | Source: Ambulatory Visit | Attending: Family Medicine | Admitting: Family Medicine

## 2013-05-25 ENCOUNTER — Other Ambulatory Visit: Payer: Self-pay | Admitting: Family Medicine

## 2013-05-25 ENCOUNTER — Encounter: Payer: Self-pay | Admitting: Nurse Practitioner

## 2013-05-25 ENCOUNTER — Ambulatory Visit (INDEPENDENT_AMBULATORY_CARE_PROVIDER_SITE_OTHER): Payer: Medicare Other | Admitting: Nurse Practitioner

## 2013-05-25 VITALS — BP 152/76 | HR 64 | Ht 61.0 in | Wt 130.0 lb

## 2013-05-25 DIAGNOSIS — B373 Candidiasis of vulva and vagina: Secondary | ICD-10-CM

## 2013-05-25 DIAGNOSIS — M25512 Pain in left shoulder: Secondary | ICD-10-CM

## 2013-05-25 DIAGNOSIS — B3731 Acute candidiasis of vulva and vagina: Secondary | ICD-10-CM

## 2013-05-25 MED ORDER — FLUCONAZOLE 150 MG PO TABS: 150.0000 mg | ORAL_TABLET | Freq: Once | ORAL | Status: DC

## 2013-05-25 MED ORDER — ESTROGENS, CONJUGATED 0.625 MG/GM VA CREA: 1.0000 | TOPICAL_CREAM | Freq: Every day | VAGINAL | Status: DC

## 2013-05-25 NOTE — Progress Notes (Signed)
Encounter reviewed by Dr. Bristyl Mclees Silva.  

## 2013-05-25 NOTE — Patient Instructions (Signed)
Monilial Vaginitis  Vaginitis in a soreness, swelling and redness (inflammation) of the vagina and vulva. Monilial vaginitis is not a sexually transmitted infection.  CAUSES   Yeast vaginitis is caused by yeast (candida) that is normally found in your vagina. With a yeast infection, the candida has overgrown in number to a point that upsets the chemical balance.  SYMPTOMS   · White, thick vaginal discharge.  · Swelling, itching, redness and irritation of the vagina and possibly the lips of the vagina (vulva).  · Burning or painful urination.  · Painful intercourse.  DIAGNOSIS   Things that may contribute to monilial vaginitis are:  · Postmenopausal and virginal states.  · Pregnancy.  · Infections.  · Being tired, sick or stressed, especially if you had monilial vaginitis in the past.  · Diabetes. Good control will help lower the chance.  · Birth control pills.  · Tight fitting garments.  · Using bubble bath, feminine sprays, douches or deodorant tampons.  · Taking certain medications that kill germs (antibiotics).  · Sporadic recurrence can occur if you become ill.  TREATMENT   Your caregiver will give you medication.  · There are several kinds of anti monilial vaginal creams and suppositories specific for monilial vaginitis. For recurrent yeast infections, use a suppository or cream in the vagina 2 times a week, or as directed.  · Anti-monilial or steroid cream for the itching or irritation of the vulva may also be used. Get your caregiver's permission.  · Painting the vagina with methylene blue solution may help if the monilial cream does not work.  · Eating yogurt may help prevent monilial vaginitis.  HOME CARE INSTRUCTIONS   · Finish all medication as prescribed.  · Do not have sex until treatment is completed or after your caregiver tells you it is okay.  · Take warm sitz baths.  · Do not douche.  · Do not use tampons, especially scented ones.  · Wear cotton underwear.  · Avoid tight pants and panty  hose.  · Tell your sexual partner that you have a yeast infection. They should go to their caregiver if they have symptoms such as mild rash or itching.  · Your sexual partner should be treated as well if your infection is difficult to eliminate.  · Practice safer sex. Use condoms.  · Some vaginal medications cause latex condoms to fail. Vaginal medications that harm condoms are:  · Cleocin cream.  · Butoconazole (Femstat®).  · Terconazole (Terazol®) vaginal suppository.  · Miconazole (Monistat®) (may be purchased over the counter).  SEEK MEDICAL CARE IF:   · You have a temperature by mouth above 102° F (38.9° C).  · The infection is getting worse after 2 days of treatment.  · The infection is not getting better after 3 days of treatment.  · You develop blisters in or around your vagina.  · You develop vaginal bleeding, and it is not your menstrual period.  · You have pain when you urinate.  · You develop intestinal problems.  · You have pain with sexual intercourse.  Document Released: 11/14/2004 Document Revised: 04/29/2011 Document Reviewed: 07/29/2008  ExitCare® Patient Information ©2014 [employer].

## 2013-05-25 NOTE — Progress Notes (Signed)
Subjective:     Patient ID: Diana Hurley, female   DOB: 12/10/1935, 78 y.o.   MRN: [id number]  HPI  This 78 yo WM Fe complains of vaginal itching and burring that started last week.  Has tried OTC med's without help. No urinary symptoms fever or chills.  She did restart some Premarin vaginal cream that she had left and maybe slight help. Last HGB AIC was 6.2.  No other medication changes or antibiotics.   Review of Systems  Constitutional: Negative for fever, chills and fatigue.  Respiratory: Negative.   Cardiovascular: Negative.   Gastrointestinal: Negative.  Negative for nausea, vomiting, abdominal pain, diarrhea and constipation.  Genitourinary: Positive for vaginal discharge. Negative for dysuria, urgency, frequency and pelvic pain.  Skin: Negative.   Neurological: Negative.   Psychiatric/Behavioral: Negative.        Objective:   Physical Exam  Constitutional: She is oriented to person, place, and time. She appears well-developed and well-nourished.  Abdominal: Soft. She exhibits no distension. There is no tenderness. There is no rebound and no guarding.  No flank pain  Genitourinary:  External  vulvar irritation from scratching. Very  little white vaginal discharge. A lot of atrophic changes. No pain on bimanual or over the bladder. Wet prep: Ph- 5.5; NSS: negative; KOH: + yeast.  Neurological: She is alert and oriented to person, place, and time.  Psychiatric: She has a normal mood and affect. Her behavior is normal. Judgment and thought content normal.       Assessment:     Yeast vulva vaginitis Atrophic vaginitis    Plan:     Diflucan 150 mg X 2 doses Refill Premarin vaginal cream  Plan to recheck her in 2 weeks

## 2013-06-08 ENCOUNTER — Encounter: Payer: Self-pay | Admitting: Nurse Practitioner

## 2013-06-08 ENCOUNTER — Ambulatory Visit (INDEPENDENT_AMBULATORY_CARE_PROVIDER_SITE_OTHER): Payer: Medicare Other | Admitting: Nurse Practitioner

## 2013-06-08 VITALS — BP 140/76 | HR 60 | Ht 61.0 in | Wt 128.0 lb

## 2013-06-08 DIAGNOSIS — N760 Acute vaginitis: Secondary | ICD-10-CM

## 2013-06-08 NOTE — Progress Notes (Signed)
Patient ID: Diana Hurley, female   DOB: [birth year], 78 y.o.   MRN: [id number] 78 y.o. G8P2003 Married African American Fe here for a recheck on vaginal yeast and atrophy. Has some itching or irritation after using her [employer] soaps.  . Currently using Premarin vaginal cream twice weekly.  At her visit today I wanted to get an updated history.  No LMP recorded. Patient is postmenopausal.          Sexually active: no  The current method of family planning is abstinence.    Exercising: yes  Gym/ health club routine includes water aerobics, aerobics and strength exercises at [facility].  Also works in garden.. Smoker:  no  Health Maintenance: Pap:  05/08/01, WNL (TAH) MMG: 3/ 2015, 3D normal, report to Dr. Sandi Mariscal Colonoscopy:  ? due this year BMD:   2013, report to Dr. Sandi Mariscal TDaP:  UTD Shingles 2015 Labs:  PCP, pt has CPE appt with PCP in May   reports that she has never smoked. She has never used smokeless tobacco. She reports that she drinks alcohol. She reports that she does not use illicit drugs.  Past Medical History  Diagnosis Date  . Essential hypertension, benign   . Shortness of breath   . Hyperlipidemia   . Palpitations     recurrent  . Vertigo   . Sickle cell trait   . Allergic rhinitis   . Lactose intolerance   . DDD (degenerative disc disease) 1998    Exacerbation in 2004 w/right radicular Sx  . DJD (degenerative joint disease)   . Osteopenia 2000  . Diverticulosis 2005    Found during colonoscopy  . Fatty infiltration of liver 2001  . Bradycardia   . DOE (dyspnea on exertion) 03/01/12    Sx have increased over past several months, referral to Thompson Grayer  . Muscle cramps     In hands and thighs  . PVC's (premature ventricular contractions) 2011  . PSVT (paroxysmal supraventricular tachycardia) 2011  . Hypertension   . Elevated liver enzymes 2001    Secondary to a gallstone & fatty filtration of the liver  . Hypercholesterolemia 2003  . Diabetes  mellitus, type 2 2007  . Venous insufficiency 2009  . Vitamin D deficiency 2010  . Atrophic vaginitis   . Diastolic dysfunction     GRADE 1   . Mitral regurgitation   . Musculoskeletal chest pain   . Breast thickening     left    Past Surgical History  Procedure Laterality Date  . Cholecystectomy  2006  . Total abdominal hysterectomy  1982    for fibroids    Current Outpatient Prescriptions  Medication Sig Dispense Refill  . ACCU-CHEK SMARTVIEW test strip as directed.      Marland Kitchen amLODipine (NORVASC) 2.5 MG tablet Take 1 tablet by mouth daily.      Marland Kitchen aspirin 81 MG tablet Take 81 mg by mouth daily.        . Blood Glucose Calibration (ACCU-CHEK SMARTVIEW CONTROL) LIQD as directed.      . Calcium Carbonate-Vitamin D (CALCIUM-VITAMIN D) 600-200 MG-UNIT CAPS Take 2 tablets by mouth daily.        . Cholecalciferol 1000 UNITS capsule Take 1,000 Units by mouth daily.        Marland Kitchen conjugated estrogens (PREMARIN) vaginal cream Place 1 Applicatorful vaginally daily. Use 1/2 g vaginally twice weekly  60 g  3  . fexofenadine (ALLEGRA) 180 MG tablet Take 180 mg by mouth  daily as needed.       Marland Kitchen losartan (COZAAR) 50 MG tablet Take 100 mg by mouth daily.       . metFORMIN (GLUCOPHAGE-XR) 500 MG 24 hr tablet Take 1 tablet in the morning and 1/2 tablet in the evening      . Multiple Vitamins-Minerals (MULTIVITAL) tablet Take 1 tablet by mouth daily. Centrum Silver      . PATADAY 0.2 % SOLN Apply 1 drop to eye as needed.        No current facility-administered medications for this visit.    Family History  Problem Relation Age of Onset  . Kidney failure Mother     renal failure secondary to diabetes  . Diabetes Mother   . Cancer Mother     breast  . Cancer Father     liver  . Hypertension Sister   . Hyperlipidemia Brother   . Hypertension Sister   . CAD Sister   . Hypertension Sister   . Diabetes Sister   . Hypertension Sister   . Colon cancer Maternal Aunt     ROS:  Pertinent items are  noted in HPI.  Otherwise, a comprehensive ROS was negative.  Exam:   BP 140/76  Pulse 60  Ht 5\' 1"  (1.549 m)  Wt 128 lb (58.06 kg)  BMI 24.20 kg/m2 Height: 5\' 1"  (154.9 cm)  Ht Readings from Last 3 Encounters:  06/08/13 5\' 1"  (1.549 m)  05/25/13 5\' 1"  (1.549 m)  03/26/13 5\' 1"  (1.549 m)    General appearance: alert, cooperative and appears stated age Breasts: normal appearance, no masses or tenderness Heart: regular rate and rhythm Abdomen: soft, non-tender; no masses,  no organomegaly Extremities: extremities normal, atraumatic, no cyanosis or edema Skin: Skin color, texture, turgor normal. No rashes or lesions No abnormal inguinal nodes palpated Neurologic: Grossly normal   Pelvic: External genitalia:  no lesions, no further skin irritation              Urethra:  atrophic appearing urethra with no masses, tenderness or lesions              Bartholin's and Skene's: normal                 Vagina: atrophic appearing vagina with pale color and no discharge, no lesions              Cervix: absent              Pap taken: no Bimanual Exam:  Uterus:  uterus absent              Adnexa: no mass, fullness, tenderness                 A:  Follow up on atrophic vaginitis and recent yeast vaginitis  Soap irritation  P:   Reviewed health and wellness pertinent to exam  Mammogram due 3/16  ROI for Mammo and BMD at [facility] on breast self exam, mammography screening, adequate intake of calcium and vitamin D, diet and exercise, Kegel's exercises return annually or prn  An After Visit Summary was printed and given to the patient.

## 2013-06-08 NOTE — Patient Instructions (Signed)

## 2013-06-12 NOTE — Progress Notes (Signed)
Encounter reviewed by Dr. Brook Silva.  

## 2013-06-30 ENCOUNTER — Encounter: Payer: Self-pay | Admitting: Internal Medicine

## 2013-08-04 ENCOUNTER — Other Ambulatory Visit: Payer: Self-pay | Admitting: *Deleted

## 2013-08-05 ENCOUNTER — Ambulatory Visit (AMBULATORY_SURGERY_CENTER): Payer: Self-pay

## 2013-08-05 VITALS — Ht 61.75 in | Wt 132.0 lb

## 2013-08-05 DIAGNOSIS — Z860100 Personal history of colon polyps, unspecified: Secondary | ICD-10-CM

## 2013-08-05 MED ORDER — SUPREP BOWEL PREP KIT 17.5-3.13-1.6 GM/177ML PO SOLN: 1.0000 | Freq: Once | ORAL | Status: DC

## 2013-08-05 NOTE — Progress Notes (Signed)
No allergies to eggs or soy No past problems with anesthesia No home oxygen No diet/weight loss meds  Has email  Emmi instructions given for colonoscopy 

## 2013-08-06 ENCOUNTER — Encounter: Payer: Self-pay | Admitting: Internal Medicine

## 2013-08-19 ENCOUNTER — Encounter: Payer: Self-pay | Admitting: Internal Medicine

## 2013-08-19 ENCOUNTER — Ambulatory Visit (AMBULATORY_SURGERY_CENTER): Payer: Medicare Other | Admitting: Internal Medicine

## 2013-08-19 VITALS — BP 138/66 | HR 47 | Temp 98.4°F | Resp 20 | Ht 61.75 in | Wt 132.0 lb

## 2013-08-19 DIAGNOSIS — Z1211 Encounter for screening for malignant neoplasm of colon: Secondary | ICD-10-CM

## 2013-08-19 LAB — GLUCOSE, CAPILLARY
Glucose-Capillary: 95 mg/dL (ref 70–99)
Glucose-Capillary: 83 mg/dL (ref 70–99)

## 2013-08-19 MED ORDER — SODIUM CHLORIDE 0.9 % IV SOLN: 500.0000 mL | INTRAVENOUS | Status: DC

## 2013-08-19 NOTE — Op Note (Signed)
[place]  [address] [place], [id number]   COLONOSCOPY PROCEDURE REPORT  PATIENT: Diana Hurley, Diana Hurley  MR#: [id number] BIRTHDATE: 1935-05-31 , 78  yrs. old GENDER: Female ENDOSCOPIST: Gatha Mayer, MD, [facility] PROCEDURE DATE:  08/19/2013 PROCEDURE:   Colonoscopy, screening First Screening Colonoscopy - Avg.  risk and is 50 yrs.  old or older - No.  Prior Negative Screening - Now for repeat screening. 10 or more years since last screening  History of Adenoma - Now for follow-up colonoscopy & has been > or = to 3 yrs.  N/A  Polyps Removed Today? No.  Recommend repeat exam, <10 yrs? No. ASA CLASS:   Class III INDICATIONS:average risk screening and Last colonoscopy performed 10 years ago. MEDICATIONS: propofol (Diprivan) 200mg  IV, MAC sedation, administered by CRNA, and These medications were titrated to patient response per physician's verbal order  DESCRIPTION OF PROCEDURE:   After the risks benefits and alternatives of the procedure were thoroughly explained, informed consent was obtained.  A digital rectal exam revealed no abnormalities of the rectum.   The LB [id number]  endoscope was introduced through the anus and advanced to the cecum, which was identified by both the appendix and ileocecal valve. No adverse events experienced.   The quality of the prep was excellent using Suprep  The instrument was then slowly withdrawn as the colon was fully examined.   COLON FINDINGS: There was mild scattered diverticulosis noted in the left colon.   The colon mucosa was otherwise normal.   A right colon retroflexion was performed.  Retroflexed views revealed no abnormalities. The time to cecum=2 minutes 25 seconds.  Withdrawal time=9 minutes 49 seconds.  The scope was withdrawn and the procedure completed. COMPLICATIONS: There were no complications.  ENDOSCOPIC IMPRESSION: 1.   There was mild diverticulosis noted in the left colon 2.   The colon mucosa  was otherwise normal - excellent prep  RECOMMENDATIONS: No need for routine colonoscopy/hemoccults see me prn   eSigned:  Gatha Mayer, MD, [facility] 08/19/2013 9:29 AM   cc: Derinda Late, MD and The Patient

## 2013-08-19 NOTE — Patient Instructions (Addendum)
No polyps or cancer seen.  NO MORE ROUTINE COLONOSCOPY!!!  I will be here if you need me for gastrointestinal problems, though.  I appreciate the opportunity to care for you. Gatha Mayer, MD, FACG YOU HAD AN ENDOSCOPIC PROCEDURE TODAY AT [facility]: Refer to the procedure report that was given to you for any specific questions about what was found during the examination.  If the procedure report does not answer your questions, please call your gastroenterologist to clarify.  If you requested that your care partner not be given the details of your procedure findings, then the procedure report has been included in a sealed envelope for you to review at your convenience later.  YOU SHOULD EXPECT: Some feelings of bloating in the abdomen. Passage of more gas than usual.  Walking can help get rid of the air that was put into your GI tract during the procedure and reduce the bloating. If you had a lower endoscopy (such as a colonoscopy or flexible sigmoidoscopy) you may notice spotting of blood in your stool or on the toilet paper. If you underwent a bowel prep for your procedure, then you may not have a normal bowel movement for a few days.  DIET: Your first meal following the procedure should be a light meal and then it is ok to progress to your normal diet.  A half-sandwich or bowl of soup is an example of a good first meal.  Heavy or fried foods are harder to digest and may make you feel nauseous or bloated.  Likewise meals heavy in dairy and vegetables can cause extra gas to form and this can also increase the bloating.  Drink plenty of fluids but you should avoid alcoholic beverages for 24 hours.  ACTIVITY: Your care partner should take you home directly after the procedure.  You should plan to take it easy, moving slowly for the rest of the day.  You can resume normal activity the day after the procedure however you should NOT DRIVE or use heavy machinery for 24 hours (because of  the sedation medicines used during the test).    SYMPTOMS TO REPORT IMMEDIATELY: A gastroenterologist can be reached at any hour.  During normal business hours, 8:30 AM to 5:00 PM Monday through Friday, call [phone].  After hours and on weekends, please call the GI answering service at [phone] who will take a message and have the physician on call contact you.   Following lower endoscopy (colonoscopy or flexible sigmoidoscopy):  Excessive amounts of blood in the stool  Significant tenderness or worsening of abdominal pains  Swelling of the abdomen that is new, acute  Fever of 100F or higher    FOLLOW UP: If any biopsies were taken you will be contacted by phone or by letter within the next 1-3 weeks.  Call your gastroenterologist if you have not heard about the biopsies in 3 weeks.  Our staff will call the home number listed on your records the next business day following your procedure to check on you and address any questions or concerns that you may have at that time regarding the information given to you following your procedure. This is a courtesy call and so if there is no answer at the home number and we have not heard from you through the emergency physician on call, we will assume that you have returned to your regular daily activities without incident.  SIGNATURES/CONFIDENTIALITY: You and/or your care partner have signed paperwork which  will be entered into your electronic medical record.  These signatures attest to the fact that that the information above on your After Visit Summary has been reviewed and is understood.  Full responsibility of the confidentiality of this discharge information lies with you and/or your care-partner.   Diverticulosis information given.   See Dr. Carlean Purl as needed.

## 2013-08-19 NOTE — Progress Notes (Signed)
Procedure ends, to recovery, report given and VSS. 

## 2013-08-23 ENCOUNTER — Telehealth: Payer: Self-pay | Admitting: *Deleted

## 2013-08-23 NOTE — Telephone Encounter (Signed)
  Follow up Call-  Call back number 08/19/2013  Post procedure Call Back phone  # [phone]  Permission to leave phone message Yes     Patient questions:  Do you have a fever, pain , or abdominal swelling? No. Pain Score  0 *  Have you tolerated food without any problems? Yes.    Have you been able to return to your normal activities? Yes.    Do you have any questions about your discharge instructions: Diet   No. Medications  No. Follow up visit  No.  Do you have questions or concerns about your Care? No.  Actions: * If pain score is 4 or above:0 No action needed, pain <4.

## 2013-12-20 ENCOUNTER — Encounter: Payer: Self-pay | Admitting: Internal Medicine

## 2014-05-23 ENCOUNTER — Ambulatory Visit (INDEPENDENT_AMBULATORY_CARE_PROVIDER_SITE_OTHER): Payer: Medicare Other | Admitting: Internal Medicine

## 2014-05-23 ENCOUNTER — Encounter: Payer: Self-pay | Admitting: Internal Medicine

## 2014-05-23 VITALS — BP 138/70 | HR 71 | Ht 61.75 in | Wt 129.4 lb

## 2014-05-23 DIAGNOSIS — R002 Palpitations: Secondary | ICD-10-CM | POA: Diagnosis not present

## 2014-05-23 DIAGNOSIS — I10 Essential (primary) hypertension: Secondary | ICD-10-CM

## 2014-05-23 DIAGNOSIS — R0602 Shortness of breath: Secondary | ICD-10-CM | POA: Diagnosis not present

## 2014-05-23 NOTE — Progress Notes (Signed)
Electrophysiology Office Note   Date:  05/23/2014   ID:  Diana Hurley, DOB February 19, 1935, MRN [id number]  PCP:  Marylene Land, MD  Primary Electrophysiologist: Thompson Grayer, MD    Chief Complaint  Patient presents with  . Follow-up    Bradycardia and palpitations     History of Present Illness: Diana Hurley is a 79 y.o. female who presents today for electrophysiology evaluation.   She is doing well.  Denies SOB.  Has rare palpitations.  Today, she denies symptoms of chest pain, shortness of breath, orthopnea, PND, lower extremity edema, claudication, dizziness, presyncope, syncope, bleeding, or neurologic sequela. The patient is tolerating medications without difficulties and is otherwise without complaint today.    Past Medical History  Diagnosis Date  . Essential hypertension, benign   . Shortness of breath   . Hyperlipidemia   . Palpitations     recurrent  . Vertigo   . Sickle cell trait   . Allergic rhinitis   . Lactose intolerance   . DDD (degenerative disc disease) 1998    Exacerbation in 2004 w/right radicular Sx  . DJD (degenerative joint disease)   . Osteopenia 2000  . Diverticulosis 2005    Found during colonoscopy  . Fatty infiltration of liver 2001  . Bradycardia   . DOE (dyspnea on exertion) 03/01/12    Sx have increased over past several months, referral to Thompson Grayer  . Muscle cramps     In hands and thighs  . PVC's (premature ventricular contractions) 2011  . PSVT (paroxysmal supraventricular tachycardia) 2011  . Hypertension   . Elevated liver enzymes 2001    Secondary to a gallstone & fatty filtration of the liver  . Hypercholesterolemia 2003  . Diabetes mellitus, type 2 2007  . Venous insufficiency 2009  . Vitamin D deficiency 2010  . Atrophic vaginitis   . Diastolic dysfunction     GRADE 1   . Mitral regurgitation   . Musculoskeletal chest pain   . Breast thickening     left   Past Surgical History  Procedure Laterality  Date  . Cholecystectomy  2006  . Total abdominal hysterectomy  1982    for fibroids     Current Outpatient Prescriptions  Medication Sig Dispense Refill  . ACCU-CHEK SMARTVIEW test strip as directed.    Marland Kitchen amLODipine (NORVASC) 2.5 MG tablet Take 1 tablet by mouth daily.    Marland Kitchen aspirin 81 MG tablet Take 81 mg by mouth daily.      . Blood Glucose Calibration (ACCU-CHEK SMARTVIEW CONTROL) LIQD as directed.    . Calcium Carbonate-Vitamin D (CALCIUM-VITAMIN D) 600-200 MG-UNIT CAPS Take 2 tablets by mouth daily.      . Cholecalciferol 1000 UNITS capsule Take 1,000 Units by mouth daily.      Marland Kitchen conjugated estrogens (PREMARIN) vaginal cream Place 1 Applicatorful vaginally daily as needed (dryness).    Marland Kitchen losartan (COZAAR) 100 MG tablet Take 1 tablet by mouth daily.  3  . metFORMIN (GLUCOPHAGE-XR) 500 MG 24 hr tablet Take 500 mg by mouth 2 (two) times daily.      No current facility-administered medications for this visit.    Allergies:   Penicillins; Acarbose; Ace inhibitors; Food; Glipizide; Januvia; Lovastatin; Metformin and related; and Simvastatin   Social History:  The patient  reports that she has never smoked. She has never used smokeless tobacco. She reports that she drinks alcohol. She reports that she does not use illicit drugs.   Family History:  The patient's  family history includes CAD in her sister; Cancer in her father and mother; Colon cancer in her maternal aunt; Diabetes in her mother and sister; Hyperlipidemia in her brother; Hypertension in her sister, sister, sister, and sister; Kidney failure in her mother. There is no history of Esophageal cancer, Rectal cancer, or Stomach cancer.    ROS:  Please see the history of present illness.   All other systems are reviewed and negative.    PHYSICAL EXAM: VS:  BP 138/70 mmHg  Pulse 71  Ht 5' 1.75" (1.568 m)  Wt 129 lb 6.4 oz (58.695 kg)  BMI 23.87 kg/m2 , BMI Body mass index is 23.87 kg/(m^2). GEN: Well nourished, well  developed, in no acute distress HEENT: normal Neck: no JVD, carotid bruits, or masses Cardiac: RRR; no murmurs, rubs, or gallops,no edema  Respiratory:  clear to auscultation bilaterally, normal work of breathing GI: soft, nontender, nondistended, + BS MS: no deformity or atrophy Skin: warm and dry  Neuro:  Strength and sensation are intact Psych: euthymic mood, full affect  EKG:  EKG is ordered today. The ekg ordered today shows sinus rhythm, normal ekg   Recent Labs: No results found for requested labs within last 365 days.    Lipid Panel  No results found for: CHOL, TRIG, HDL, CHOLHDL, VLDL, LDLCALC, LDLDIRECT   Wt Readings from Last 3 Encounters:  05/23/14 129 lb 6.4 oz (58.695 kg)  08/19/13 132 lb (59.875 kg)  08/05/13 132 lb (59.875 kg)      Other studies Reviewed: Additional studies/ records that were reviewed today include: echo and myoview, [employer] note  Review of the above records today demonstrates: normal echo, normal myoview   ASSESSMENT AND PLAN:  1.  Palpitations Unclear etiology Likely pacs/pvcs Not interested in wearing another monitor  2. SOB resolved  3. HTN Stable No change required today  4. Sinus bradycardia asymptomatic   Current medicines are reviewed at length with the patient today.   The patient does not have concerns regarding her medicines.  The following changes were made today:  none   Follow-up: return in 1 year   Signed, Thompson Grayer, MD  05/23/2014 3:59 PM     [address] [place] [address] [phone] (office) [phone] (fax)

## 2014-05-23 NOTE — Patient Instructions (Signed)
Your physician wants you to follow-up in: 12 months with Dr Allred You will receive a reminder letter in the mail two months in advance. If you don't receive a letter, please call our office to schedule the follow-up appointment.  

## 2014-07-21 ENCOUNTER — Ambulatory Visit (INDEPENDENT_AMBULATORY_CARE_PROVIDER_SITE_OTHER): Payer: Medicare Other | Admitting: Nurse Practitioner

## 2014-07-21 ENCOUNTER — Encounter: Payer: Self-pay | Admitting: Nurse Practitioner

## 2014-07-21 VITALS — BP 130/70 | HR 82 | Ht 62.0 in | Wt 127.4 lb

## 2014-07-21 DIAGNOSIS — Z124 Encounter for screening for malignant neoplasm of cervix: Secondary | ICD-10-CM

## 2014-07-21 DIAGNOSIS — Z01419 Encounter for gynecological examination (general) (routine) without abnormal findings: Secondary | ICD-10-CM

## 2014-07-21 MED ORDER — ESTROGENS, CONJUGATED 0.625 MG/GM VA CREA: 1.0000 | TOPICAL_CREAM | Freq: Every day | VAGINAL | Status: DC | PRN

## 2014-07-21 MED ORDER — FLUCONAZOLE 150 MG PO TABS: ORAL_TABLET | ORAL | Status: DC

## 2014-07-21 NOTE — Progress Notes (Signed)
79 y.o. G32P2003 Married  African American Fe here for annual exam. She is doing well and having only occasional episodes of flare of yeast.  Does well on Diflucan for prn use.  She does still use the Premarin vaginal cream to the urethra and introitus area prn for dryness.  No LMP recorded. Patient has had a hysterectomy.          Sexually active: Yes.    The current method of family planning is status post hysterectomy.    Exercising: Yes.    exercises at the [employer] 4-5x/wk Smoker:  no  Health Maintenance: Pap:  4-5 years wnl MMG: 06/28/14 normal Colonoscopy:  08/19/13 normal per Dr. Carlean Purl no more colonoscopies needed! BMD: 2 years ago  TDaP:  06/2014 report to Dr. Sandi Mariscal Shingles 2015 Prevnar 13: 2016 Labs: Done by PCP   reports that she has never smoked. She has never used smokeless tobacco. She reports that she drinks alcohol. She reports that she does not use illicit drugs.  Past Medical History  Diagnosis Date  . Essential hypertension, benign   . Shortness of breath   . Hyperlipidemia   . Palpitations     recurrent  . Vertigo   . Sickle cell trait   . Allergic rhinitis   . Lactose intolerance   . DDD (degenerative disc disease) 1998    Exacerbation in 2004 w/right radicular Sx  . DJD (degenerative joint disease)   . Osteopenia 2000  . Diverticulosis 2005    Found during colonoscopy  . Fatty infiltration of liver 2001  . Bradycardia   . DOE (dyspnea on exertion) 03/01/12    Sx have increased over past several months, referral to Thompson Grayer  . Muscle cramps     In hands and thighs  . PVC's (premature ventricular contractions) 2011  . PSVT (paroxysmal supraventricular tachycardia) 2011  . Hypertension   . Elevated liver enzymes 2001    Secondary to a gallstone & fatty filtration of the liver  . Hypercholesterolemia 2003  . Diabetes mellitus, type 2 2007  . Venous insufficiency 2009  . Vitamin D deficiency 2010  . Atrophic vaginitis   . Diastolic dysfunction     GRADE 1   . Mitral regurgitation   . Musculoskeletal chest pain   . Breast thickening     left    Past Surgical History  Procedure Laterality Date  . Cholecystectomy  2006  . Total abdominal hysterectomy  1982    for fibroids    Current Outpatient Prescriptions  Medication Sig Dispense Refill  . ACCU-CHEK SMARTVIEW test strip as directed.    Marland Kitchen aspirin 81 MG tablet Take 81 mg by mouth daily.      . Blood Glucose Calibration (ACCU-CHEK SMARTVIEW CONTROL) LIQD as directed.    . Calcium Carbonate-Vitamin D (CALCIUM-VITAMIN D) 600-200 MG-UNIT CAPS Take 2 tablets by mouth daily.      Marland Kitchen conjugated estrogens (PREMARIN) vaginal cream Place 1 Applicatorful vaginally daily as needed (dryness). 42.5 g 4  . losartan (COZAAR) 100 MG tablet Take 1 tablet by mouth daily.  3  . metFORMIN (GLUCOPHAGE-XR) 500 MG 24 hr tablet Take 500 mg by mouth 2 (two) times daily.     Marland Kitchen amLODipine (NORVASC) 5 MG tablet   0  . CRESTOR 5 MG tablet   1  . fluconazole (DIFLUCAN) 150 MG tablet 1 tablet weekly X 4 4 tablet 1   No current facility-administered medications for this visit.    Family History  Problem Relation Age of Onset  . Kidney failure Mother     renal failure secondary to diabetes  . Diabetes Mother   . Cancer Mother     breast  . Cancer Father     liver  . Hypertension Sister   . Hyperlipidemia Brother   . Hypertension Sister   . CAD Sister   . Hypertension Sister   . Diabetes Sister   . Hypertension Sister   . Colon cancer Maternal Aunt   . Esophageal cancer Neg Hx   . Rectal cancer Neg Hx   . Stomach cancer Neg Hx     ROS:  Pertinent items are noted in HPI.  Otherwise, a comprehensive ROS was negative.  Exam:   BP 130/70 mmHg  Pulse 82  Ht 5\' 2"  (1.575 m)  Wt 127 lb 6.4 oz (57.788 kg)  BMI 23.30 kg/m2 Height: 5\' 2"  (157.5 cm) Ht Readings from Last 3 Encounters:  07/21/14 5\' 2"  (1.575 m)  05/23/14 5' 1.75" (1.568 m)  08/19/13 5' 1.75" (1.568 m)    General appearance:  alert, cooperative and appears stated age Head: Normocephalic, without obvious abnormality, atraumatic Neck: no adenopathy, supple, symmetrical, trachea midline and thyroid normal to inspection and palpation Lungs: clear to auscultation bilaterally Breasts: normal appearance, no masses or tenderness Heart: regular rate and rhythm Abdomen: soft, non-tender; no masses,  no organomegaly Extremities: extremities normal, atraumatic, no cyanosis or edema Skin: Skin color, texture, turgor normal. No rashes or lesions Lymph nodes: Cervical, supraclavicular, and axillary nodes normal. No abnormal inguinal nodes palpated Neurologic: Grossly normal   Pelvic: External genitalia:  no lesions              Urethra:  normal appearing urethra with no masses, tenderness or lesions              Bartholin's and Skene's: normal                 Vagina: normal appearing vagina with normal color and discharge, no lesions              Cervix: absent              Pap taken: No. Bimanual Exam:  Uterus:  uterus absent              Adnexa: no mass, fullness, tenderness               Rectovaginal: Confirms               Anus:  normal sphincter tone, no lesions  Chaperone present: No  A:  Well Woman with normal exam  S/P TAH 1982 for fibroids  History of chronic yeast - mostly occurs with change of soaps or pads  History of DDD, diastolic dysfunction, mitral regurgitation, Vit d deficiency  P:   Reviewed health and wellness pertinent to exam  Pap smear as above  Mammogram is due 06/2015  Refill Diflucan to use only prn  Counseled on breast self exam, mammography screening, use and side effects of HRT, adequate intake of calcium and vitamin D, diet and exercise, Kegel's exercises return annually or prn  An After Visit Summary was printed and given to the patient.

## 2014-07-21 NOTE — Patient Instructions (Signed)

## 2014-07-24 NOTE — Progress Notes (Signed)
Encounter reviewed by Dr. Vickey Boak Silva.  

## 2014-08-16 ENCOUNTER — Encounter: Payer: Self-pay | Admitting: Internal Medicine

## 2014-12-08 ENCOUNTER — Other Ambulatory Visit: Payer: Self-pay

## 2014-12-08 NOTE — Telephone Encounter (Signed)
I think she should stop the estrogen cream due to her cardiovascular issues.  She can use topical vaseline or olive oil or coconut oil for dryness.  Rx denied.  CC:  Diana Hurley.

## 2014-12-08 NOTE — Telephone Encounter (Signed)
Medication refill request: Premarin cream Last AEX:  07/21/14 Next AEX: 07/25/15 Last MMG (if hormonal medication request): 06/28/14 3D-BiRads 1-negative Refill authorized: patient states is only using a small amount as needed, not often, maybe only once a month

## 2014-12-08 NOTE — Telephone Encounter (Signed)
lmtcb-asking how she is using her Premarin cream and if she wants [facility].//kn

## 2014-12-09 ENCOUNTER — Telehealth: Payer: Self-pay

## 2014-12-09 NOTE — Telephone Encounter (Signed)
Unable to leave message on home #-just rang, no answer or machine.//kn

## 2015-01-09 NOTE — Telephone Encounter (Signed)
Patient notified that Rx for premarin cream has been denied and that she should stop using this due to her cardiovascular issues. Also, aware of Dr Ammie Ferrier recommendation that she can try topical Vaseline, olive oil, or coconut oil. Patient agreeable.//kn

## 2015-06-19 DIAGNOSIS — M810 Age-related osteoporosis without current pathological fracture: Secondary | ICD-10-CM

## 2015-06-19 HISTORY — DX: Age-related osteoporosis without current pathological fracture: M81.0

## 2015-07-25 ENCOUNTER — Ambulatory Visit: Payer: Medicare Other | Admitting: Nurse Practitioner

## 2015-08-08 ENCOUNTER — Ambulatory Visit (INDEPENDENT_AMBULATORY_CARE_PROVIDER_SITE_OTHER): Payer: Medicare HMO | Admitting: Nurse Practitioner

## 2015-08-08 ENCOUNTER — Encounter: Payer: Self-pay | Admitting: Nurse Practitioner

## 2015-08-08 VITALS — BP 128/66 | HR 68 | Resp 16 | Ht 61.25 in | Wt 129.0 lb

## 2015-08-08 DIAGNOSIS — Z01419 Encounter for gynecological examination (general) (routine) without abnormal findings: Secondary | ICD-10-CM | POA: Diagnosis not present

## 2015-08-08 MED ORDER — FLUCONAZOLE 150 MG PO TABS: ORAL_TABLET | ORAL | Status: DC

## 2015-08-08 NOTE — Patient Instructions (Signed)

## 2015-08-08 NOTE — Progress Notes (Signed)
80 y.o. G2P2003 Married  African American Fe here for annual exam.  Now on Fosamax x 2 doses. HGB AIC 6.2.  She is having less problems with yeast and tries to only wear cotton.  Husband with ED and rarely SA.  No LMP recorded. Patient has had a hysterectomy.          Sexually active: Yes.    The current method of family planning is status post hysterectomy.    Exercising: Yes.    zumba, water, walking Smoker:  no  Health Maintenance: Pap:  07/21/14 Normal  MMG:  06/30/2015 Normal Colonoscopy:  08/19/13 Normal. No repeat needed will need IFOB yearly in 2020 BMD:   06/30/2015 Osteoporosis w/ PCP TDaP:  06/2014  Shingles: 2015  Pneumonia: 2016 Hep C and HIV: does not need Labs: PCP   reports that she has never smoked. She has never used smokeless tobacco. She reports that she does not drink alcohol or use illicit drugs.  Past Medical History  Diagnosis Date  . Essential hypertension, benign   . Shortness of breath   . Hyperlipidemia   . Palpitations     recurrent  . Vertigo   . Sickle cell trait ([place])   . Allergic rhinitis   . Lactose intolerance   . DDD (degenerative disc disease) 1998    Exacerbation in 2004 w/right radicular Sx  . DJD (degenerative joint disease)   . Osteopenia 2000  . Diverticulosis 2005    Found during colonoscopy  . Fatty infiltration of liver 2001  . Bradycardia   . DOE (dyspnea on exertion) 03/01/12    Sx have increased over past several months, referral to Thompson Grayer  . Muscle cramps     In hands and thighs  . PVC's (premature ventricular contractions) 2011  . PSVT (paroxysmal supraventricular tachycardia) ([place]) 2011  . Hypertension   . Elevated liver enzymes 2001    Secondary to a gallstone & fatty filtration of the liver  . Hypercholesterolemia 2003  . Diabetes mellitus, type 2 ([place]) 2007  . Venous insufficiency 2009  . Vitamin D deficiency 2010  . Atrophic vaginitis   . Diastolic dysfunction     GRADE 1   . Mitral regurgitation   .  Musculoskeletal chest pain   . Breast thickening     left  . Osteoporosis 06/2015    Right Hip    Past Surgical History  Procedure Laterality Date  . Cholecystectomy  2006  . Total abdominal hysterectomy  1982    for fibroids    Current Outpatient Prescriptions  Medication Sig Dispense Refill  . ACCU-CHEK SMARTVIEW test strip as directed.    Marland Kitchen alendronate (FOSAMAX) 70 MG tablet Take 1 tablet by mouth once a week.  0  . amLODipine (NORVASC) 5 MG tablet Take 5 mg by mouth daily.   0  . aspirin 81 MG tablet Take 81 mg by mouth daily.      . Blood Glucose Calibration (ACCU-CHEK SMARTVIEW CONTROL) LIQD as directed.    . Calcium Carbonate-Vitamin D (CALCIUM-VITAMIN D) 600-200 MG-UNIT CAPS Take 2 tablets by mouth daily.      Marland Kitchen losartan (COZAAR) 100 MG tablet Take 1 tablet by mouth daily.  3  . metFORMIN (GLUCOPHAGE-XR) 500 MG 24 hr tablet Take 500 mg by mouth 2 (two) times daily.     . rosuvastatin (CRESTOR) 5 MG tablet Take 5 mg by mouth daily.    . fluconazole (DIFLUCAN) 150 MG tablet Take one tablet prn for  chronic yeast 4 tablet 0   No current facility-administered medications for this visit.    Family History  Problem Relation Age of Onset  . Kidney failure Mother     renal failure secondary to diabetes  . Diabetes Mother   . Breast cancer Mother 52    breast  . Liver cancer Father 21    liver  . Hypertension Sister   . Hyperlipidemia Brother   . Hypertension Sister   . CAD Sister   . Hypertension Sister   . Diabetes Sister   . Hypertension Sister   . Colon cancer Maternal Aunt   . Esophageal cancer Neg Hx   . Rectal cancer Neg Hx   . Stomach cancer Neg Hx     ROS:  Pertinent items are noted in HPI.  Otherwise, a comprehensive ROS was negative.  Exam:   BP 128/66 mmHg  Pulse 68  Resp 16  Ht 5' 1.25" (1.556 m)  Wt 129 lb (58.514 kg)  BMI 24.17 kg/m2 Height: 5' 1.25" (155.6 cm) Ht Readings from Last 3 Encounters:  08/08/15 5' 1.25" (1.556 m)  07/21/14 5\' 2"   (1.575 m)  05/23/14 5' 1.75" (1.568 m)    General appearance: alert, cooperative and appears stated age Head: Normocephalic, without obvious abnormality, atraumatic Neck: no adenopathy, supple, symmetrical, trachea midline and thyroid normal to inspection and palpation   Lungs: clear to auscultation bilaterally Breasts: normal appearance, no masses or tenderness Heart: regular rate and rhythm Abdomen: soft, non-tender; no masses,  no organomegaly Extremities: extremities normal, atraumatic, no cyanosis or edema Skin: Skin color, texture, turgor normal. No rashes or lesions Lymph nodes: Cervical, supraclavicular, and axillary nodes normal. No abnormal inguinal nodes palpated Neurologic: Grossly normal   Pelvic: External genitalia:  no lesions              Urethra:  normal appearing urethra with no masses, tenderness or lesions              Bartholin's and Skene's: normal                 Vagina: normal appearing vagina with normal color and discharge, no lesions              Cervix: absent              Pap taken: No. Bimanual Exam:  Uterus:  uterus absent              Adnexa: no mass, fullness, tenderness               Rectovaginal: Confirms               Anus:  normal sphincter tone, no lesions  Chaperone present: no  A:  Well Woman with normal exam  S/P TAH 1982 for fibroids History of chronic yeast - mostly occurs with change of soaps or pads History of DDD, diastolic dysfunction, mitral regurgitation, Vit d deficiency  Osteoporosis now treated X 2 doses by PCP  P:   Reviewed health and wellness pertinent to exam  Pap smear as above  Mammogram is due 06/2016  ROI for Mammo and BMD 5 2018  Counseled on breast self exam, mammography screening, osteoporosis, adequate intake of calcium and vitamin D, diet and exercise, Kegel's exercises return annually or prn  An After Visit Summary was printed and given to the patient.

## 2015-08-09 NOTE — Progress Notes (Signed)
Encounter reviewed Jill Jertson, MD   

## 2015-08-17 ENCOUNTER — Encounter (HOSPITAL_COMMUNITY): Payer: Medicare Other

## 2015-08-17 ENCOUNTER — Other Ambulatory Visit: Payer: Self-pay | Admitting: *Deleted

## 2015-08-17 DIAGNOSIS — I6523 Occlusion and stenosis of bilateral carotid arteries: Secondary | ICD-10-CM

## 2015-08-18 ENCOUNTER — Ambulatory Visit (HOSPITAL_COMMUNITY)
Admission: RE | Admit: 2015-08-18 | Discharge: 2015-08-18 | Disposition: A | Discharge status: Home / Self Care | Payer: Medicare HMO | Source: Ambulatory Visit | Attending: Vascular Surgery | Admitting: Vascular Surgery

## 2015-08-18 DIAGNOSIS — D573 Sickle-cell trait: Secondary | ICD-10-CM | POA: Insufficient documentation

## 2015-08-18 DIAGNOSIS — E785 Hyperlipidemia, unspecified: Secondary | ICD-10-CM | POA: Diagnosis not present

## 2015-08-18 DIAGNOSIS — I10 Essential (primary) hypertension: Secondary | ICD-10-CM | POA: Insufficient documentation

## 2015-08-18 DIAGNOSIS — E119 Type 2 diabetes mellitus without complications: Secondary | ICD-10-CM | POA: Diagnosis not present

## 2015-08-18 DIAGNOSIS — I6523 Occlusion and stenosis of bilateral carotid arteries: Secondary | ICD-10-CM

## 2015-08-18 LAB — VAS US CAROTID
LEFT ECA DIAS: -8 cm/s
LEFT VERTEBRAL DIAS: 15 cm/s
LICADDIAS: 24 cm/s
LICAPDIAS: -19 cm/s
Left CCA dist dias: -19 cm/s
Left CCA dist sys: -87 cm/s
Left CCA prox dias: 17 cm/s
Left CCA prox sys: 96 cm/s
Left ICA dist sys: 113 cm/s
Left ICA prox sys: -68 cm/s
RCCADSYS: -98 cm/s
RIGHT CCA MID DIAS: 13 cm/s
RIGHT ECA DIAS: -8 cm/s
Right CCA prox dias: -16 cm/s
Right CCA prox sys: -151 cm/s

## 2016-03-22 ENCOUNTER — Ambulatory Visit
Admission: RE | Admit: 2016-03-22 | Discharge: 2016-03-22 | Disposition: A | Discharge status: Home / Self Care | Payer: Medicare HMO | Source: Ambulatory Visit | Attending: Family Medicine | Admitting: Family Medicine

## 2016-03-22 ENCOUNTER — Other Ambulatory Visit: Payer: Self-pay | Admitting: Family Medicine

## 2016-03-22 DIAGNOSIS — M25512 Pain in left shoulder: Secondary | ICD-10-CM

## 2016-03-31 IMAGING — CR DG SHOULDER 2+V*L*
3 series · 3 of 3 positions shown · non-contrast
Comparison: None.

CLINICAL DATA: Pain

EXAM:
LEFT SHOULDER - 2+ VIEW

[view not recorded (1 of 3)]
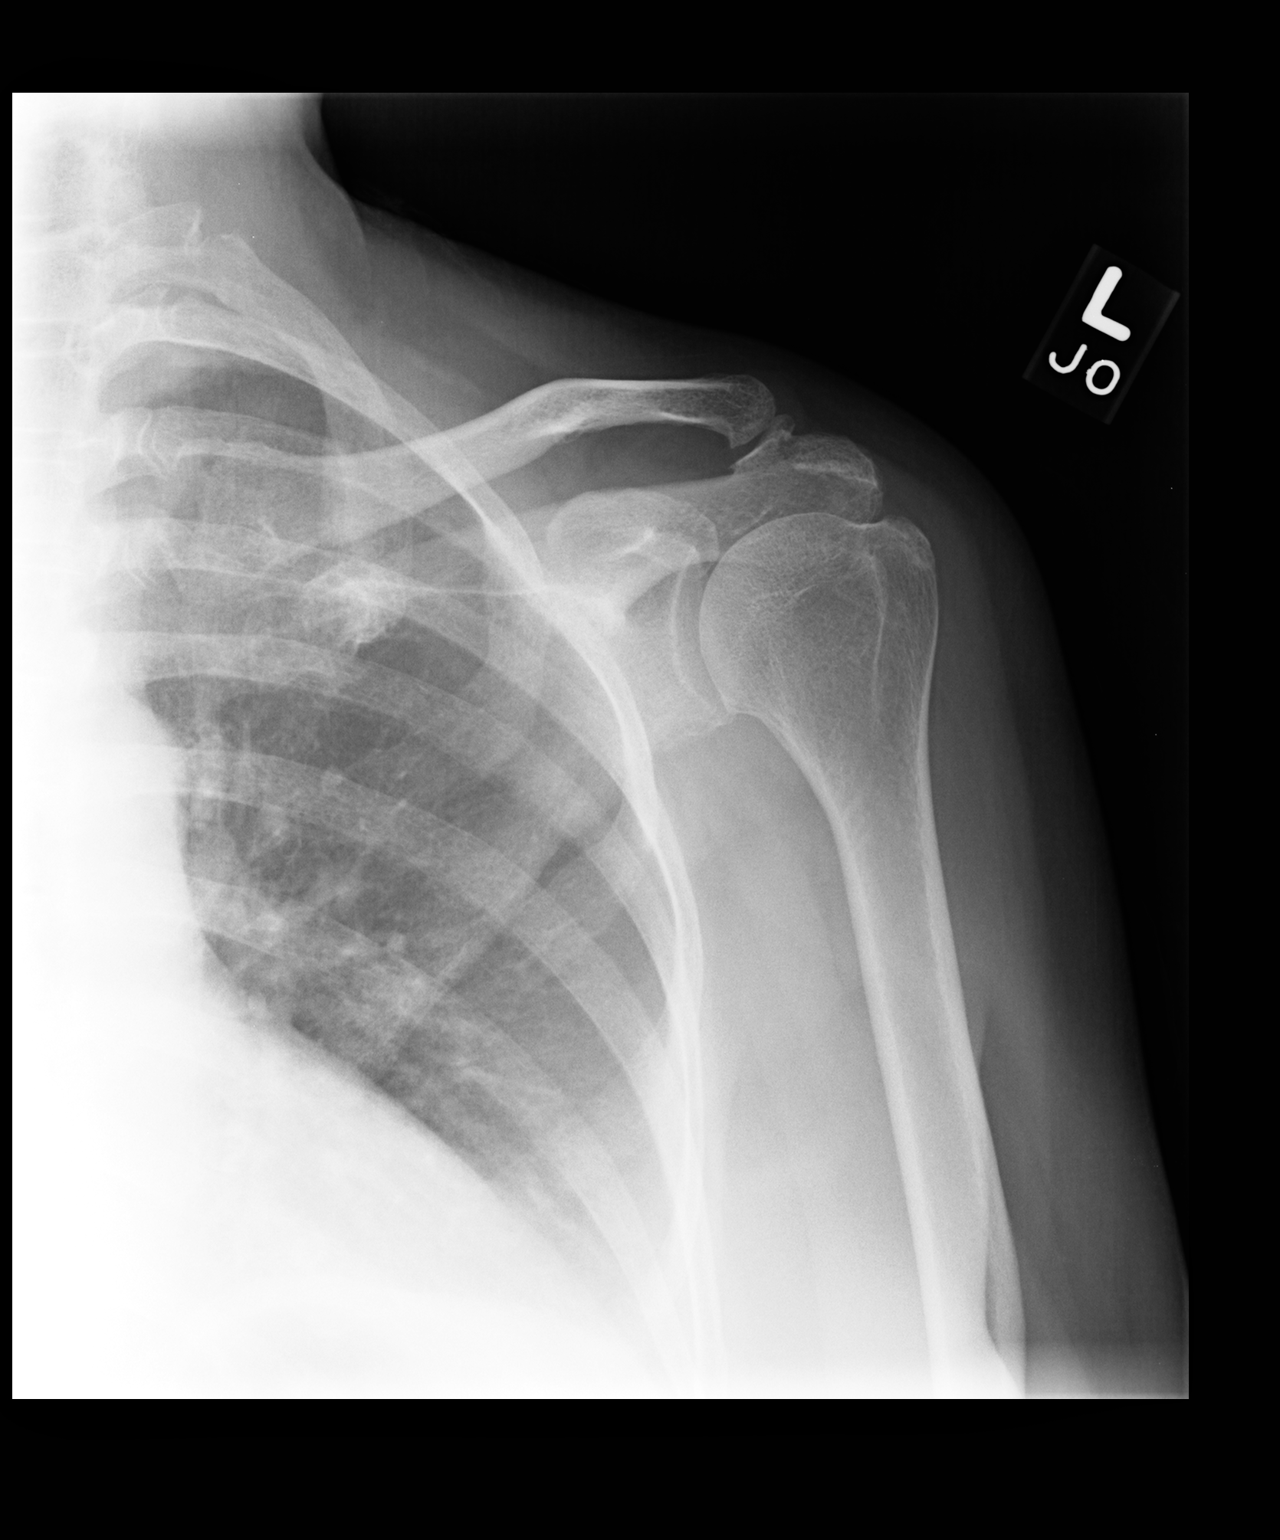

[view not recorded (2 of 3)]
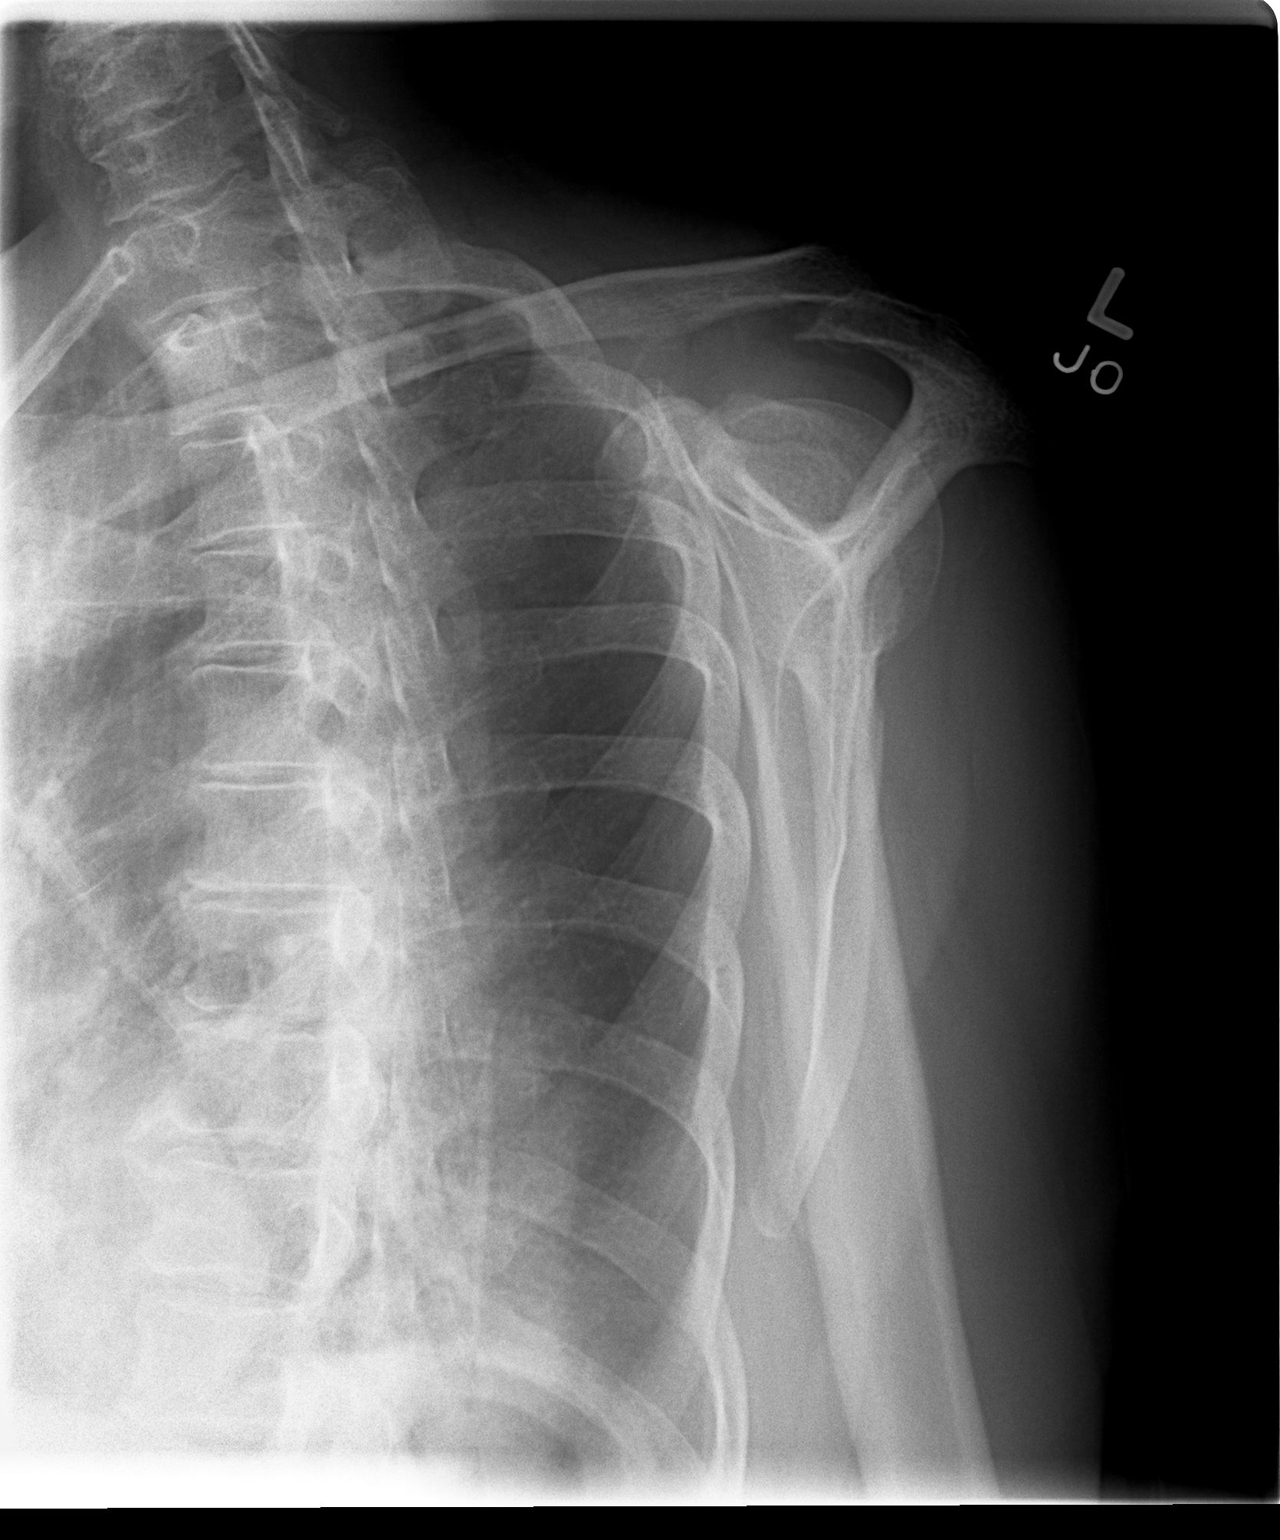

[view not recorded (3 of 3)]
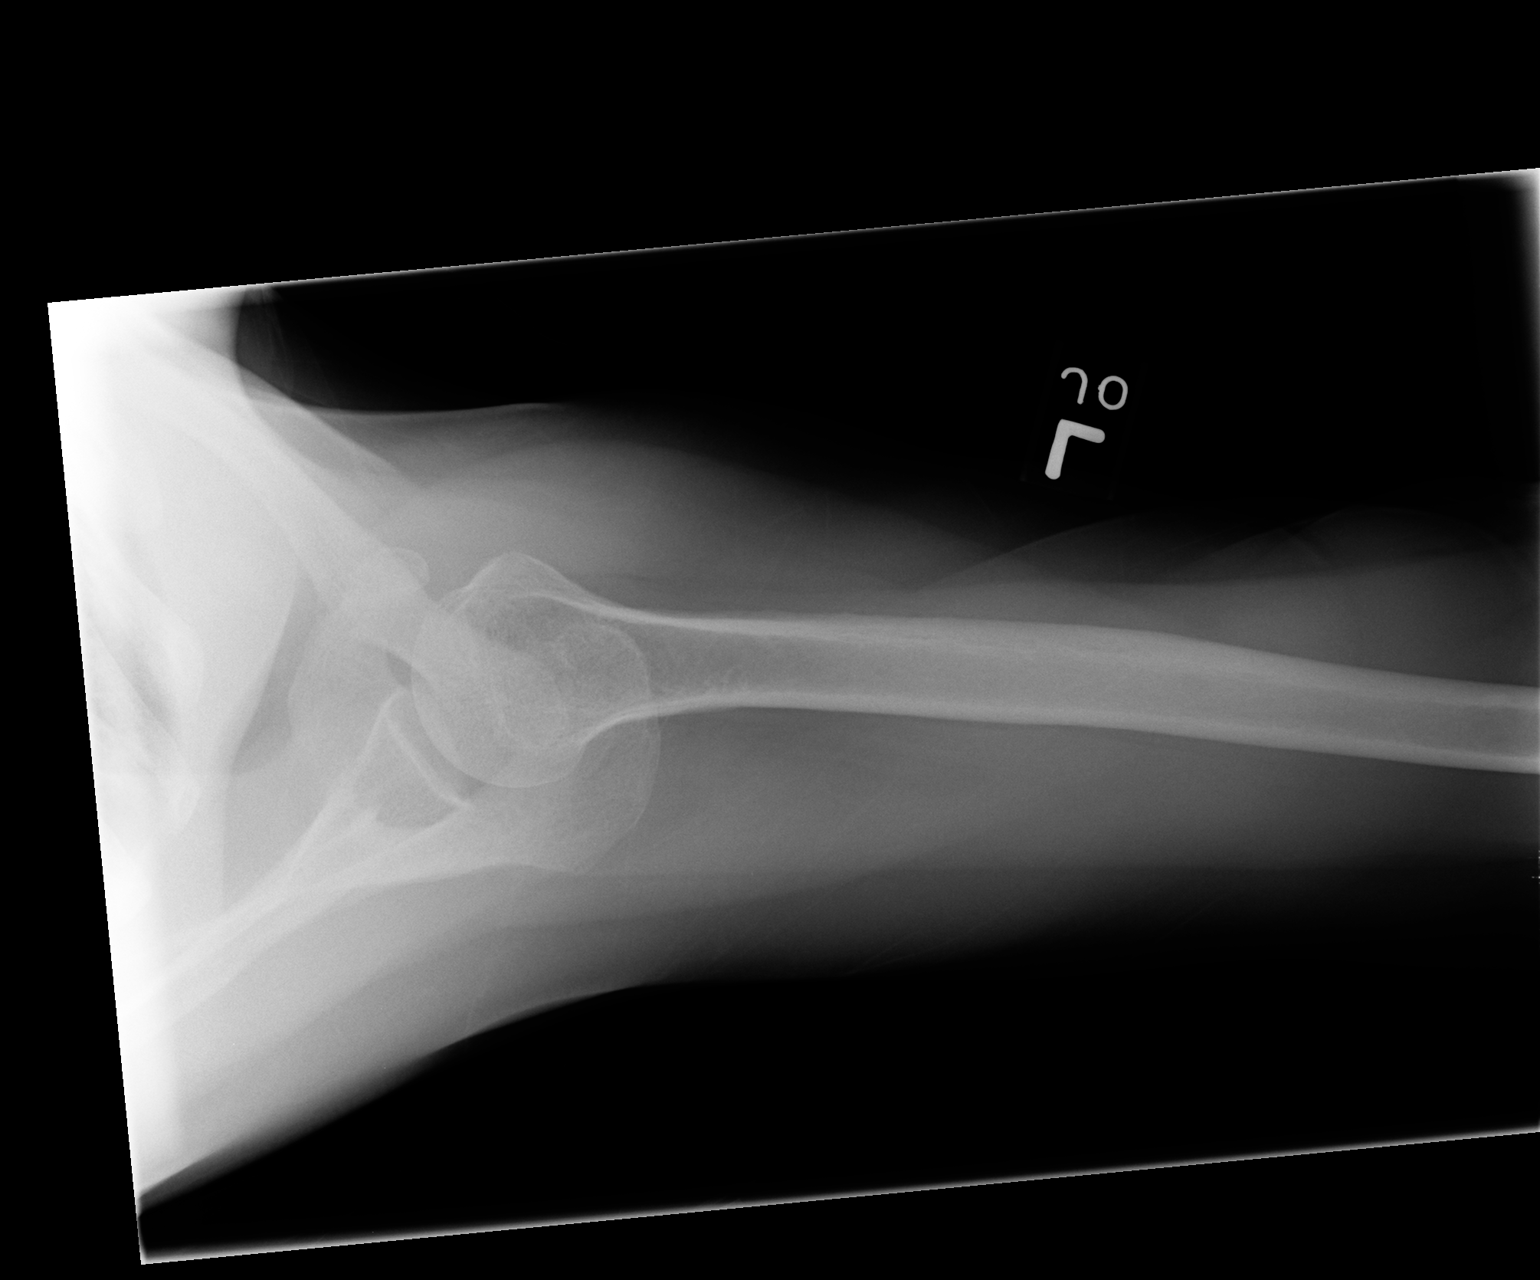

[3 of 3 positions shown; findings below may reference images not displayed]

FINDINGS: There is no evidence of fracture or dislocation. Hypertrophic
spurring and periarticular sclerosis within the acromioclavicular
joint and to a lesser extent the glenohumeral joint. Soft tissues
unremarkable.
IMPRESSION: No evidence of acute osseus abnormalities. Osteoarthritic changes
within the shoulder.

## 2016-08-09 ENCOUNTER — Ambulatory Visit: Payer: Medicare HMO | Admitting: Nurse Practitioner

## 2016-08-16 ENCOUNTER — Encounter: Payer: Self-pay | Admitting: Nurse Practitioner

## 2016-08-16 ENCOUNTER — Ambulatory Visit (INDEPENDENT_AMBULATORY_CARE_PROVIDER_SITE_OTHER): Payer: Medicare HMO | Admitting: Nurse Practitioner

## 2016-08-16 VITALS — BP 144/72 | HR 60 | Ht 61.5 in | Wt 128.0 lb

## 2016-08-16 DIAGNOSIS — Z01419 Encounter for gynecological examination (general) (routine) without abnormal findings: Secondary | ICD-10-CM | POA: Diagnosis not present

## 2016-08-16 NOTE — Progress Notes (Signed)
Patient ID: Diana Hurley, female   DOB: February 21, 1935, 81 y.o.   MRN: [id number]  81 y.o. G2P2003 Married  African American Fe here for annual exam.  Some problems with abdominal pain and loose stools that is thought to be related to Metformin.  Off med's  X 1 week.  Last HGB AIC was 5.8.  Still on Fosamax and believe that it is doing well.  No LMP recorded. Patient has had a hysterectomy.          Sexually active: Yes.    The current method of family planning is status post hysterectomy.    Exercising: Yes.    [employer] 4 days per week-Zumba, water aerobics, walking, stretching Smoker:  no  Health Maintenance: Pap:   07/21/14, Negative with neg HR HPV  05/08/01, Negative History of Abnormal Pap: no MMG:  06/2016 [facility], will call for report  Self Breast exams: sometimes Colonoscopy:  08/19/13, normal, no repeat needed, will need IFOB yearly in 2020 BMD:   06/30/15, -0.50 Spine / -2.80 Right Femur Neck / -2.20 Left Femur Neck TDaP:  06/2014 Shingles: 2015 Pneumonia: 2016 Hep C and HIV: Not indicated due to age Labs: PCP   reports that she has never smoked. She has never used smokeless tobacco. She reports that she does not drink alcohol or use drugs.  Past Medical History:  Diagnosis Date  . Allergic rhinitis   . Atrophic vaginitis   . Bradycardia   . Breast thickening    left  . DDD (degenerative disc disease) 1998   Exacerbation in 2004 w/right radicular Sx  . Diabetes mellitus, type 2 ([place]) 2007  . Diastolic dysfunction    GRADE 1   . Diverticulosis 2005   Found during colonoscopy  . DJD (degenerative joint disease)   . DOE (dyspnea on exertion) 03/01/12   Sx have increased over past several months, referral to [facility]  . Elevated liver enzymes 2001   Secondary to a gallstone & fatty filtration of the liver  . Essential hypertension, benign   . Fatty infiltration of liver 2001  . Hypercholesterolemia 2003  . Hyperlipidemia   . Hypertension   . Lactose intolerance   .  Mitral regurgitation   . Muscle cramps    In hands and thighs  . Musculoskeletal chest pain   . Osteopenia 2000  . Osteoporosis 06/2015   Right Hip  . Palpitations    recurrent  . PSVT (paroxysmal supraventricular tachycardia) ([place]) 2011  . PVC's (premature ventricular contractions) 2011  . Shortness of breath   . Sickle cell trait ([place])   . Venous insufficiency 2009  . Vertigo   . Vitamin D deficiency 2010    Past Surgical History:  Procedure Laterality Date  . CHOLECYSTECTOMY  2006  . TOTAL ABDOMINAL HYSTERECTOMY  1982   for fibroids    Current Outpatient Prescriptions  Medication Sig Dispense Refill  . ACCU-CHEK SMARTVIEW test strip as directed.    Marland Kitchen alendronate (FOSAMAX) 70 MG tablet Take 1 tablet by mouth once a week.  0  . amLODipine (NORVASC) 5 MG tablet Take 5 mg by mouth daily.   0  . aspirin 81 MG tablet Take 81 mg by mouth daily.      . Blood Glucose Calibration (ACCU-CHEK SMARTVIEW CONTROL) LIQD as directed.    . Calcium Carbonate-Vitamin D (CALCIUM-VITAMIN D) 600-200 MG-UNIT CAPS Take 2 tablets by mouth daily.      . fluconazole (DIFLUCAN) 150 MG tablet Take one tablet prn  for chronic yeast 4 tablet 0  . losartan (COZAAR) 100 MG tablet Take 1 tablet by mouth daily.  3  . rosuvastatin (CRESTOR) 5 MG tablet Take 5 mg by mouth daily.    . metFORMIN (GLUCOPHAGE-XR) 500 MG 24 hr tablet Take 500 mg by mouth 2 (two) times daily.      No current facility-administered medications for this visit.     Family History  Problem Relation Age of Onset  . Kidney failure Mother        renal failure secondary to diabetes  . Diabetes Mother   . Breast cancer Mother 27       breast  . Liver cancer Father 11       liver  . Hypertension Sister   . Hyperlipidemia Brother   . Hypertension Sister   . CAD Sister   . Hypertension Sister   . Diabetes Sister   . Hypertension Sister   . Colon cancer Maternal Aunt   . Colon cancer Maternal Uncle   . Esophageal cancer Neg Hx    . Rectal cancer Neg Hx   . Stomach cancer Neg Hx     ROS:  Pertinent items are noted in HPI.  Otherwise, a comprehensive ROS was negative.  Exam:   BP (!) 144/72 (BP Location: Right Arm, Patient Position: Sitting, Cuff Size: Normal)   Pulse 60   Ht 5' 1.5" (1.562 m)   Wt 128 lb (58.1 kg)   BMI 23.79 kg/m  Height: 5' 1.5" (156.2 cm) Ht Readings from Last 3 Encounters:  08/16/16 5' 1.5" (1.562 m)  08/08/15 5' 1.25" (1.556 m)  07/21/14 '5\' 2"'$  (1.575 m)    General appearance: alert, cooperative and appears stated age Head: Normocephalic, without obvious abnormality, atraumatic Neck: no adenopathy, supple, symmetrical, trachea midline and thyroid normal to inspection and palpation Lungs: clear to auscultation bilaterally Breasts: normal appearance, no masses or tenderness Heart: regular rate and rhythm Abdomen: soft, non-tender; no masses,  no organomegaly Extremities: extremities normal, atraumatic, no cyanosis or edema Skin: Skin color, texture, turgor normal. No rashes or lesions Lymph nodes: Cervical, supraclavicular, and axillary nodes normal. No abnormal inguinal nodes palpated Neurologic: Grossly normal   Pelvic: External genitalia:  no lesions              Urethra:  normal appearing urethra with no masses, tenderness or lesions              Bartholin's and Skene's: normal                 Vagina: normal appearing vagina with normal color and discharge, no lesions              Cervix: absent              Pap taken: No. Bimanual Exam:  Uterus:  uterus absent              Adnexa: no mass, fullness, tenderness               Rectovaginal: Confirms               Anus:  normal sphincter tone, no lesions  Chaperone present: no  A:  Well Woman with normal exam  S/P TAH 1982 for fibroids  History of DDD, diastolic dysfunction, mitral regurgitation  History of Vit D deficiency, history of DM  History of Osteoporosis - PCP is following and treating now with Fosamax since  6/17  P:   Reviewed  health and wellness pertinent to exam  Pap smear: no  IFOB kit given by PCP  Mammogram is due 5/19 if this one was normal  Counseled on breast self exam, mammography screening, adequate intake of calcium and vitamin D, diet and exercise return annually or prn  An After Visit Summary was printed and given to the patient.

## 2016-08-16 NOTE — Patient Instructions (Addendum)

## 2016-08-16 NOTE — Progress Notes (Signed)
Encounter reviewed by Dr. Ryo Klang Amundson C. Silva.  

## 2016-08-20 ENCOUNTER — Ambulatory Visit: Payer: Medicare HMO | Admitting: Nurse Practitioner

## 2017-03-19 ENCOUNTER — Encounter: Payer: Self-pay | Admitting: Internal Medicine

## 2017-03-19 ENCOUNTER — Ambulatory Visit (INDEPENDENT_AMBULATORY_CARE_PROVIDER_SITE_OTHER): Payer: Medicare HMO | Admitting: Internal Medicine

## 2017-03-19 VITALS — BP 148/68 | HR 76 | Ht 61.5 in | Wt 135.2 lb

## 2017-03-19 DIAGNOSIS — R768 Other specified abnormal immunological findings in serum: Secondary | ICD-10-CM

## 2017-03-19 DIAGNOSIS — R1084 Generalized abdominal pain: Secondary | ICD-10-CM

## 2017-03-19 DIAGNOSIS — R197 Diarrhea, unspecified: Secondary | ICD-10-CM | POA: Diagnosis not present

## 2017-03-19 NOTE — Progress Notes (Signed)
Diana Hurley 82 y.o. Aug 26, 1935 [id number]  Assessment & Plan:   Encounter Diagnoses  Name Primary?  . Generalized abdominal cramping Yes  . Diarrhea,episodic w/ + lactoferrin   . Helicobacter pylori antibody positive    Given pt's symptom presentation and positive H.pylori AB, recommended EGD with biopsies to test for active H. Pylori infection and other possible etiologies for pt's sxs (GERD, gastritis, ulcer etc.) She has been off her PPI for 2 weeks. Pt was counseled on risks of the procedure and she verbalized understanding.  Her episodic diarrhea could be multifactorial. It is possible that she has microscopic colitis, although given that she is still able to have normal stools, it is unlikely. She could have an underlying infectious or inflammatory process such as IBS or crohn's causing flare-ups of diarrhea and positive lactoferrin. Will hold off on evaluation of diarrhea until further work up for gastric sxs is complete.  I have personally seen the patient, reviewed and repeated key elements of the history and physical and participated in formation of the assessment and plan the student has documented.  Gatha Mayer, MD, [facility]  YN:[id number], Collier [place], MD    Subjective:   Chief Complaint: stomach pain  HPI Pt is a very nice 82 yo AA female presents today with CC of abdominal pain that has been going over for approximately a year. Her pain onset is usually 10 minutes after eating, occasionally on empty stomach, feels crampy with association of episodes of diarrhea following these episodes of abd cramping. She did try pepto bismol once last year which slightly helped with her symptoms. Her PCP thought her sxs may be related to metformin use so it was stopped, her symptoms improved however she continues to get flare-ups randomly. She was tested for H.Pylori on serology few months ago which was positive for antibodies. She was started on clarithromyin, metronidazole, and  pantoprazole. Pt wasn't able to tolerate abx (nausea), kept taking pantoprazole with relief but she stopped taking it 2 weeks ago following symptom resolution. Her sxs have returned since stopping pantoprazole. She also received stool analysis which was positive for lactoferrin, but negative for O and P, cx, and C diff. Denies heart burn, belching, dysphagia, hematemesis, change in appetite, weight loss, hematochezia, or melena.Colonoscopy 2015 - diverticulosis, otherwise negative screening exam.  Allergies  Allergen Reactions  . Penicillins Other (See Comments)    syncope  . Acarbose Other (See Comments)    Bloating and gas  . Ace Inhibitors Other (See Comments)    angioedema  . Fluvastatin Other (See Comments)    myalgias  . Food     Lactose intolerant  . Glipizide Other (See Comments)    hypoglycemia  . Januvia [Sitagliptin Phosphate] Other (See Comments)    myalgias  . Lovastatin Other (See Comments)    myalgias  . Pravastatin Other (See Comments)    myalgias  . Simvastatin Other (See Comments)    myalgias    Current Outpatient Medications:  .  ACCU-CHEK SMARTVIEW test strip, as directed., Disp: , Rfl:  .  alendronate (FOSAMAX) 70 MG tablet, Take 1 tablet by mouth once a week., Disp: , Rfl: 0 .  amLODipine (NORVASC) 5 MG tablet, Take 5 mg by mouth daily. , Disp: , Rfl: 0 .  aspirin 81 MG tablet, Take 81 mg by mouth daily.  , Disp: , Rfl:  .  Blood Glucose Calibration (ACCU-CHEK SMARTVIEW CONTROL) LIQD, as directed., Disp: , Rfl:  .  Calcium Carbonate-Vitamin D (CALCIUM-VITAMIN  D) 600-200 MG-UNIT CAPS, Take 2 tablets by mouth daily.  , Disp: , Rfl:  .  losartan (COZAAR) 100 MG tablet, Take 1 tablet by mouth daily., Disp: , Rfl: 3 .  rosuvastatin (CRESTOR) 5 MG tablet, Take 5 mg by mouth daily., Disp: , Rfl:   Past Medical History:  Diagnosis Date  . Allergic rhinitis   . Atrophic vaginitis   . Bradycardia   . Breast thickening    left  . DDD (degenerative disc disease)  1998   Exacerbation in 2004 w/right radicular Sx  . Diabetes mellitus, type 2 ([place]) 2007  . Diastolic dysfunction    GRADE 1   . Diverticulosis 2005   Found during colonoscopy  . DJD (degenerative joint disease)   . DOE (dyspnea on exertion) 03/01/12   Sx have increased over past several months, referral to [facility]  . Elevated liver enzymes 2001   Secondary to a gallstone & fatty filtration of the liver  . Essential hypertension, benign   . Fatty infiltration of liver 2001  . Gastritis   . Hypercholesterolemia 2003  . Hyperlipidemia   . Hypertension   . Lactose intolerance   . Mitral regurgitation   . Muscle cramps    In hands and thighs  . Musculoskeletal chest pain   . Osteopenia 2000  . Osteoporosis 06/2015   Right Hip  . Palpitations    recurrent  . PSVT (paroxysmal supraventricular tachycardia) ([place]) 2011  . PVC's (premature ventricular contractions) 2011  . Shortness of breath   . Sickle cell trait ([place])   . Venous insufficiency 2009  . Vertigo   . Vitamin D deficiency 2010   Past Surgical History:  Procedure Laterality Date  . CHOLECYSTECTOMY  2006  . COLONOSCOPY    . TOTAL ABDOMINAL HYSTERECTOMY  1982   for fibroids   Social History   Social History Narrative   Married   3 children   Retired   No alcohol, tobacco, drugs   family history includes Breast cancer (age of onset: 27) in her mother; CAD in her sister; Colon cancer in her maternal aunt and maternal uncle; Diabetes in her mother and sister; Hyperlipidemia in her brother; Hypertension in her sister, sister, sister, and sister; Kidney failure in her mother; Liver cancer (age of onset: 57) in her father; Stroke in her sister.   Review of Systems Positive for abdominal pain, nausea, diarrhea. Negative for appetite changes, weight loss, fatigue, heart burn, dysphagia, belching, constipation, vomiting, hematemesis, melena, or hematochezia. Negative for all other systems.  Objective:   Physical  Exam  @BP  (!) 148/68 (BP Location: Left Arm, Patient Position: Sitting, Cuff Size: Normal)   Pulse 76   Ht 5' 1.5" (1.562 m) Comment: height measured without shoes  Wt 135 lb 4 oz (61.3 kg)   BMI 25.14 kg/m @  General:  Well-developed, well-nourished and in no acute distress Eyes:  anicteric.  Lungs: Clear to auscultation bilaterally. Heart:  S1S2, no rubs, murmurs, gallops. Abdomen:  soft, non-tender, no hepatosplenomegaly, hernia, or mass and BS+, no abdominal bruits. Extremities:   no edema, cyanosis or clubbing Skin   no rash. Neuro:  A&O x 3.  Psych:  appropriate mood and  Affect.   Data Reviewed: colonoscopy, PCP notes, labs, meds.

## 2017-03-19 NOTE — Patient Instructions (Signed)

## 2017-03-21 HISTORY — PX: ESOPHAGOGASTRODUODENOSCOPY: SHX1529

## 2017-04-09 ENCOUNTER — Ambulatory Visit (AMBULATORY_SURGERY_CENTER): Payer: Medicare HMO | Admitting: Internal Medicine

## 2017-04-09 ENCOUNTER — Encounter: Payer: Self-pay | Admitting: Internal Medicine

## 2017-04-09 ENCOUNTER — Other Ambulatory Visit: Payer: Self-pay

## 2017-04-09 VITALS — BP 125/47 | HR 54 | Temp 97.7°F | Resp 12 | Ht 61.0 in | Wt 135.0 lb

## 2017-04-09 DIAGNOSIS — R1084 Generalized abdominal pain: Secondary | ICD-10-CM

## 2017-04-09 DIAGNOSIS — K2960 Other gastritis without bleeding: Secondary | ICD-10-CM | POA: Diagnosis not present

## 2017-04-09 DIAGNOSIS — K259 Gastric ulcer, unspecified as acute or chronic, without hemorrhage or perforation: Secondary | ICD-10-CM | POA: Diagnosis not present

## 2017-04-09 DIAGNOSIS — K317 Polyp of stomach and duodenum: Secondary | ICD-10-CM | POA: Diagnosis not present

## 2017-04-09 MED ORDER — SODIUM CHLORIDE 0.9 % IV SOLN: 500.0000 mL | INTRAVENOUS | Status: DC

## 2017-04-09 NOTE — Op Note (Signed)
[place] Patient Name: Diana Hurley Procedure Date: 04/09/2017 2:41 PM MRN: [id number] Endoscopist: Gatha Mayer , MD Age: 82 Referring MD:  Date of Birth: 1935/10/16 Gender: Female Account #: [id number] Procedure:                Upper GI endoscopy Indications:              Epigastric abdominal pain Medicines:                Propofol per Anesthesia, Monitored Anesthesia Care Procedure:                Pre-Anesthesia Assessment:                           - Prior to the procedure, a History and Physical                            was performed, and patient medications and                            allergies were reviewed. The patient's tolerance of                            previous anesthesia was also reviewed. The risks                            and benefits of the procedure and the sedation                            options and risks were discussed with the patient.                            All questions were answered, and informed consent                            was obtained. Prior Anticoagulants: The patient has                            taken no previous anticoagulant or antiplatelet                            agents. ASA Grade Assessment: II - A patient with                            mild systemic disease. After reviewing the risks                            and benefits, the patient was deemed in                            satisfactory condition to undergo the procedure.                           After obtaining informed consent, the endoscope was  passed under direct vision. Throughout the                            procedure, the patient's blood pressure, pulse, and                            oxygen saturations were monitored continuously. The                            Endoscope was introduced through the mouth, and                            advanced to the second part of duodenum. The upper                            GI  endoscopy was accomplished without difficulty.                            The patient tolerated the procedure well. Scope In: Scope Out: Findings:                 Localized moderate inflammation characterized by                            erosions was found in the gastric antrum. Biopsies                            were taken with a cold forceps for histology.                            Verification of patient identification for the                            specimen was done. Estimated blood loss was minimal.                           Diffuse mild inflammation characterized by erythema                            and granularity was found in the gastric body.                            Biopsies were taken with a cold forceps for                            histology. Verification of patient identification                            for the specimen was done. Estimated blood loss was                            minimal.                           The exam  was otherwise without abnormality.                           The cardia and gastric fundus were otherwise normal                            on retroflexion. Complications:            No immediate complications. Estimated Blood Loss:     Estimated blood loss was minimal. Impression:               - Gastritis. Biopsied.                           - Gastritis. Biopsied.                           - The examination was otherwise normal. Recommendation:           - Patient has a contact number available for                            emergencies. The signs and symptoms of potential                            delayed complications were discussed with the                            patient. Return to normal activities tomorrow.                            Written discharge instructions were provided to the                            patient.                           - Resume previous diet.                           - Continue present  medications.                           - Await pathology results.                           - Will see if H pylori present and will try to                            retreat if so.                           If not take PPI and determine next steps re:                            abdominal cramps and episodic diarrhea. Gatha Mayer, MD 04/09/2017 2:56:37 PM This report has been signed electronically.

## 2017-04-09 NOTE — Progress Notes (Signed)
Pt's states no medical or surgical changes since previsit or office visit. 

## 2017-04-09 NOTE — Progress Notes (Signed)
To PACU, VSS. Report to [email or web address] 

## 2017-04-09 NOTE — Patient Instructions (Addendum)
   There was inflammation in the stomach and I took biopsies to see why (? Infection).  I will let you know.  I appreciate the opportunity to care for you. Gatha Mayer, MD, [facility]  Handout given for gastritis  YOU HAD AN ENDOSCOPIC PROCEDURE TODAY AT [facility]:   Refer to the procedure report that was given to you for any specific questions about what was found during the examination.  If the procedure report does not answer your questions, please call your gastroenterologist to clarify.  If you requested that your care partner not be given the details of your procedure findings, then the procedure report has been included in a sealed envelope for you to review at your convenience later.  YOU SHOULD EXPECT: Some feelings of bloating in the abdomen. Passage of more gas than usual.  Walking can help get rid of the air that was put into your GI tract during the procedure and reduce the bloating. If you had a lower endoscopy (such as a colonoscopy or flexible sigmoidoscopy) you may notice spotting of blood in your stool or on the toilet paper. If you underwent a bowel prep for your procedure, you may not have a normal bowel movement for a few days.  Please Note:  You might notice some irritation and congestion in your nose or some drainage.  This is from the oxygen used during your procedure.  There is no need for concern and it should clear up in a day or so.  SYMPTOMS TO REPORT IMMEDIATELY:   Following upper endoscopy (EGD)  Vomiting of blood or coffee ground material  New chest pain or pain under the shoulder blades  Painful or persistently difficult swallowing  New shortness of breath  Fever of 100F or higher  Black, tarry-looking stools  For urgent or emergent issues, a gastroenterologist can be reached at any hour by calling [phone].   DIET:  We do recommend a small meal at first, but then you may proceed to your regular diet.  Drink plenty of fluids  but you should avoid alcoholic beverages for 24 hours.  ACTIVITY:  You should plan to take it easy for the rest of today and you should NOT DRIVE or use heavy machinery until tomorrow (because of the sedation medicines used during the test).    FOLLOW UP: Our staff will call the number listed on your records the next business day following your procedure to check on you and address any questions or concerns that you may have regarding the information given to you following your procedure. If we do not reach you, we will leave a message.  However, if you are feeling well and you are not experiencing any problems, there is no need to return our call.  We will assume that you have returned to your regular daily activities without incident.  If any biopsies were taken you will be contacted by phone or by letter within the next 1-3 weeks.  Please call us at [phone] if you have not heard about the biopsies in 3 weeks.    SIGNATURES/CONFIDENTIALITY: You and/or your care partner have signed paperwork which will be entered into your electronic medical record.  These signatures attest to the fact that that the information above on your After Visit Summary has been reviewed and is understood.  Full responsibility of the confidentiality of this discharge information lies with you and/or your care-partner.

## 2017-04-09 NOTE — Progress Notes (Signed)
Called to room to assist during endoscopic procedure.  Patient ID and intended procedure confirmed with present staff. Received instructions for my participation in the procedure from the performing physician.  

## 2017-04-10 ENCOUNTER — Telehealth: Payer: Self-pay | Admitting: *Deleted

## 2017-04-10 NOTE — Telephone Encounter (Signed)
  Follow up Call-  Call back number 04/09/2017  Post procedure Call Back phone  # [phone]  Permission to leave phone message Yes  Some recent data might be hidden     Patient questions:  Do you have a fever, pain , or abdominal swelling? No. Pain Score  0 *  Have you tolerated food without any problems? Yes.    Have you been able to return to your normal activities? Yes.    Do you have any questions about your discharge instructions: Diet   No. Medications  No. Follow up visit  No.  Do you have questions or concerns about your Care? No.  Actions: * If pain score is 4 or above: No action needed, pain <4.

## 2017-04-18 ENCOUNTER — Other Ambulatory Visit: Payer: Self-pay

## 2017-04-18 MED ORDER — PANTOPRAZOLE SODIUM 20 MG PO TBEC: 20.0000 mg | DELAYED_RELEASE_TABLET | Freq: Every day | ORAL | 3 refills | Status: DC

## 2017-04-18 NOTE — Progress Notes (Signed)
Please call from [facility], no [place] letter or recall needed  Biopsies show inflammation without infection and no cancer  Start pantoprazole 20 mg po qd # 90 3 refill  Next available OV me please

## 2017-07-02 ENCOUNTER — Ambulatory Visit (INDEPENDENT_AMBULATORY_CARE_PROVIDER_SITE_OTHER): Payer: Medicare HMO | Admitting: Internal Medicine

## 2017-07-02 ENCOUNTER — Encounter: Payer: Self-pay | Admitting: Internal Medicine

## 2017-07-02 VITALS — BP 158/68 | HR 72 | Ht 61.5 in | Wt 139.8 lb

## 2017-07-02 DIAGNOSIS — K2960 Other gastritis without bleeding: Secondary | ICD-10-CM

## 2017-07-02 NOTE — Patient Instructions (Signed)
  Follow up with Dr Carlean Purl as needed.   Stop the Protonix.   I appreciate the opportunity to care for you. Silvano Rusk, MD, [facility]

## 2017-07-02 NOTE — Progress Notes (Signed)
Diana Hurley 82 y.o. 11/07/1935 [id number]  Assessment & Plan:   Encounter Diagnosis  Name Primary?  . Erosive gastritis Yes   Doing well, cause not clear aspirin, question losartan which the patient tells me Dr. Sandi Mariscal said is a rare cause of this.  The good news is that she feels fine.  No further work-up or treatment needed and I will see her as needed.  I appreciate the opportunity to care for this patient. CC: Derinda Late, MD  Subjective:   Chief Complaint: Follow-up of gastritis  HPI The patient is here for follow-up, she was seen in January with abdominal pain, she had had a positive H. pylori antibody.  She underwent an EGD which showed an erosive gastritis without H. pylori.  She was treated with a PPI.  Dr. Sandi Mariscal stopped aspirin and losartan.  Symptoms stopped.  She has recently stopped pantoprazole and still feels well.  Celebrated her 82th birthday on Mother's Day. Allergies  Allergen Reactions  . Penicillins Other (See Comments)    syncope  . Acarbose Other (See Comments)    Bloating and gas  . Ace Inhibitors Other (See Comments)    angioedema  . Fluvastatin Other (See Comments)    myalgias  . Food     Lactose intolerant  . Glipizide Other (See Comments)    hypoglycemia  . Januvia [Sitagliptin Phosphate] Other (See Comments)    myalgias  . Lovastatin Other (See Comments)    myalgias  . Pravastatin Other (See Comments)    myalgias  . Simvastatin Other (See Comments)    myalgias   Current Meds  Medication Sig  . ACCU-CHEK SMARTVIEW test strip as directed.  Marland Kitchen amLODipine (NORVASC) 5 MG tablet Take 5 mg by mouth daily.   . Blood Glucose Calibration (ACCU-CHEK SMARTVIEW CONTROL) LIQD as directed.  . Calcium Carbonate-Vitamin D (CALCIUM-VITAMIN D) 600-200 MG-UNIT CAPS Take 2 tablets by mouth daily.     Past Medical History:  Diagnosis Date  . Allergic rhinitis   . Atrophic vaginitis   . Bradycardia   . Breast thickening    left  .  DDD (degenerative disc disease) 1998   Exacerbation in 2004 w/right radicular Sx  . Diabetes mellitus, type 2 ([place]) 2007  . Diastolic dysfunction    GRADE 1   . Diverticulosis 2005   Found during colonoscopy  . DJD (degenerative joint disease)   . DOE (dyspnea on exertion) 03/01/12   Sx have increased over past several months, referral to [facility]  . Elevated liver enzymes 2001   Secondary to a gallstone & fatty filtration of the liver  . Essential hypertension, benign   . Fatty infiltration of liver 2001  . Gastritis   . Hypercholesterolemia 2003  . Hyperlipidemia   . Hypertension   . Lactose intolerance   . Mitral regurgitation   . Muscle cramps    In hands and thighs  . Musculoskeletal chest pain   . Osteopenia 2000  . Osteoporosis 06/2015   Right Hip  . Palpitations    recurrent  . PSVT (paroxysmal supraventricular tachycardia) ([place]) 2011  . PVC's (premature ventricular contractions) 2011  . Shortness of breath   . Sickle cell trait ([place])   . Venous insufficiency 2009  . Vertigo   . Vitamin D deficiency 2010   Past Surgical History:  Procedure Laterality Date  . CHOLECYSTECTOMY  2006  . COLONOSCOPY    . ESOPHAGOGASTRODUODENOSCOPY  03/2017   gastritis  . TOTAL ABDOMINAL HYSTERECTOMY  1982   for fibroids   Review of Systems  As above Objective:   Physical Exam BP (!) 158/68   Pulse 72   Ht 5' 1.5" (1.562 m)   Wt 139 lb 12.8 oz (63.4 kg)   BMI 25.99 kg/m  nad Younger looking

## 2017-08-26 ENCOUNTER — Encounter: Payer: Self-pay | Admitting: Obstetrics and Gynecology

## 2017-08-26 ENCOUNTER — Ambulatory Visit (INDEPENDENT_AMBULATORY_CARE_PROVIDER_SITE_OTHER): Payer: Medicare HMO | Admitting: Obstetrics and Gynecology

## 2017-08-26 ENCOUNTER — Other Ambulatory Visit: Payer: Self-pay

## 2017-08-26 VITALS — BP 106/60 | HR 64 | Resp 14 | Ht 62.0 in | Wt 137.1 lb

## 2017-08-26 DIAGNOSIS — Z01419 Encounter for gynecological examination (general) (routine) without abnormal findings: Secondary | ICD-10-CM

## 2017-08-26 NOTE — Progress Notes (Signed)
82 y.o. G20P2003 Married [place] American female here for annual exam.    Having external vulvar itching.  Switches soaps from time to time.  No discharge.  No vaginal odor.  Used Vaseline in the past.  No chronic pad use.   Stopped Metformin due to abdominal pain and diarrhea.  Will start antihyperlipidemic.  Rovustatin.   Married for 59 years.  3 sones, twins.  9 grandchildren and 4 great grandchildren.  Back from [place].   PCP:   Derinda Late, MD  No LMP recorded. Patient has had a hysterectomy.           Sexually active: Yes.    The current method of family planning is status post hysterectomy.   Ovaries remain.  Exercising: Yes.    walking, water aerobics, strength training Smoker:  no  Health Maintenance: Pap:  07-21-14 negative History of abnormal Pap:  no MMG:  06-2017 at [facility].  Bi-RADS2.  Cat B density.  Colonoscopy:  08-19-13 normal, no repeat needed. BMD:   06-2017  Result  [facility]. Osteopenia.  Improved from 2017.  PCP following.  TDaP:  06-2014  Gardasil:   no HIV: years ago  Hep C: years ago  Screening Labs:  Hb today: PCP, Urine today: has sample if needed    reports that she has never smoked. She has never used smokeless tobacco. She reports that she does not drink alcohol or use drugs.  Past Medical History:  Diagnosis Date  . Allergic rhinitis   . Atrophic vaginitis   . Bradycardia   . Breast thickening    left  . DDD (degenerative disc disease) 1998   Exacerbation in 2004 w/right radicular Sx  . Diabetes mellitus, type 2 ([place]) 2007  . Diastolic dysfunction    GRADE 1   . Diverticulosis 2005   Found during colonoscopy  . DJD (degenerative joint disease)   . DOE (dyspnea on exertion) 03/01/12   Sx have increased over past several months, referral to [facility]  . Elevated liver enzymes 2001   Secondary to a gallstone & fatty filtration of the liver  . Essential hypertension, benign   . Fatty infiltration of liver 2001  . Gastritis   .  Hypercholesterolemia 2003  . Hyperlipidemia   . Hypertension   . Lactose intolerance   . Mitral regurgitation   . Muscle cramps    In hands and thighs  . Musculoskeletal chest pain   . Osteopenia 2000  . Osteoporosis 06/2015   Right Hip  . Palpitations    recurrent  . PSVT (paroxysmal supraventricular tachycardia) ([place]) 2011  . PVC's (premature ventricular contractions) 2011  . Shortness of breath   . Sickle cell trait ([place])   . Venous insufficiency 2009  . Vertigo   . Vitamin D deficiency 2010    Past Surgical History:  Procedure Laterality Date  . CHOLECYSTECTOMY  2006  . COLONOSCOPY    . ESOPHAGOGASTRODUODENOSCOPY  03/2017   gastritis  . TOTAL ABDOMINAL HYSTERECTOMY  1982   for fibroids    Current Outpatient Medications  Medication Sig Dispense Refill  . ACCU-CHEK SMARTVIEW test strip as directed.    Marland Kitchen amLODipine (NORVASC) 5 MG tablet Take 5 mg by mouth daily.   0  . Blood Glucose Calibration (ACCU-CHEK SMARTVIEW CONTROL) LIQD as directed.    . Calcium Carbonate-Vitamin D (CALCIUM-VITAMIN D) 600-200 MG-UNIT CAPS Take 2 tablets by mouth daily.      . irbesartan (AVAPRO) 300 MG tablet TAKE 1 TABLET BY  MOUTH EVERY DAY IN THE MORNING  0   No current facility-administered medications for this visit.     Family History  Problem Relation Age of Onset  . Kidney failure Mother        renal failure secondary to diabetes  . Diabetes Mother   . Breast cancer Mother 35  . Liver cancer Father 64  . Hypertension Sister   . Hyperlipidemia Brother   . Hypertension Sister   . CAD Sister   . Hypertension Sister   . Diabetes Sister   . Stroke Sister   . Hypertension Sister   . Colon cancer Maternal Aunt   . Colon cancer Maternal Uncle   . Esophageal cancer Neg Hx   . Rectal cancer Neg Hx   . Stomach cancer Neg Hx     Review of Systems  Constitutional: Negative.   HENT: Negative.   Eyes: Negative.   Respiratory: Negative.   Cardiovascular: Negative.    Gastrointestinal: Negative.   Endocrine: Negative.   Genitourinary:       Vulvar/vaginal itching  Musculoskeletal: Negative.   Allergic/Immunologic: Negative.   Neurological: Negative.   Hematological: Negative.   Psychiatric/Behavioral: Negative.     Exam:   BP 106/60 (BP Location: Right Arm, Patient Position: Sitting, Cuff Size: Normal)   Pulse 64   Resp 14   Ht 5\' 2"  (1.575 m)   Wt 137 lb 1.6 oz (62.2 kg)   BMI 25.08 kg/m     General appearance: alert, cooperative and appears stated age Head: Normocephalic, without obvious abnormality, atraumatic Neck: no adenopathy, supple, symmetrical, trachea midline and thyroid normal to inspection and palpation Lungs: clear to auscultation bilaterally Breasts: normal appearance, no masses or tenderness, No nipple retraction or dimpling, No nipple discharge or bleeding, No axillary or supraclavicular adenopathy Heart: regular rate and rhythm Abdomen: soft, non-tender; no masses, no organomegaly Extremities: extremities normal, atraumatic, no cyanosis or edema Skin: Skin color, texture, turgor normal. No rashes or lesions Lymph nodes: Cervical, supraclavicular, and axillary nodes normal. No abnormal inguinal nodes palpated Neurologic: Grossly normal  Pelvic: External genitalia:  no lesions              Urethra:  normal appearing urethra with no masses, tenderness or lesions              Bartholins and Skenes: normal                 Vagina: normal appearing vagina with normal color and discharge, no lesions              Cervix:  absent              Pap taken: No. Bimanual Exam:  Uterus:  absent              Adnexa: no mass, fullness, tenderness              Rectal exam: Yes.  .  Confirms.              Anus:  normal sphincter tone, no lesions  Chaperone was present for exam.  Assessment:   Well woman visit with normal exam. Status post TAH for fibroids.  Ovaries remain.  Osteoporosis.  Off Fosamax.  PCP following.  Intermittent  vulvitis.  I suspect this is exposure related.   Plan: Mammogram screening. Recommended self breast awareness. Pap and HR HPV as above. Guidelines for Calcium, Vitamin D, regular exercise program including cardiovascular and weight bearing exercise. We talked  about using Dove soaps and their sensitive skin line of products.  Follow up annually and prn.   After visit summary provided.

## 2017-08-26 NOTE — Patient Instructions (Signed)

## 2017-09-03 ENCOUNTER — Other Ambulatory Visit (HOSPITAL_COMMUNITY): Payer: Self-pay | Admitting: Family Medicine

## 2017-09-03 ENCOUNTER — Ambulatory Visit (HOSPITAL_COMMUNITY)
Admission: RE | Admit: 2017-09-03 | Discharge: 2017-09-03 | Disposition: A | Discharge status: Home / Self Care | Payer: Medicare HMO | Source: Ambulatory Visit | Attending: Family | Admitting: Family

## 2017-09-03 DIAGNOSIS — I6523 Occlusion and stenosis of bilateral carotid arteries: Secondary | ICD-10-CM | POA: Insufficient documentation

## 2017-09-03 DIAGNOSIS — I10 Essential (primary) hypertension: Secondary | ICD-10-CM | POA: Diagnosis not present

## 2017-09-03 DIAGNOSIS — R0989 Other specified symptoms and signs involving the circulatory and respiratory systems: Secondary | ICD-10-CM

## 2017-09-03 DIAGNOSIS — E119 Type 2 diabetes mellitus without complications: Secondary | ICD-10-CM | POA: Diagnosis not present

## 2017-09-11 ENCOUNTER — Encounter: Payer: Self-pay | Admitting: Obstetrics and Gynecology

## 2017-12-31 ENCOUNTER — Ambulatory Visit (INDEPENDENT_AMBULATORY_CARE_PROVIDER_SITE_OTHER): Payer: Medicare HMO | Admitting: Internal Medicine

## 2017-12-31 ENCOUNTER — Encounter: Payer: Self-pay | Admitting: Internal Medicine

## 2017-12-31 ENCOUNTER — Other Ambulatory Visit (INDEPENDENT_AMBULATORY_CARE_PROVIDER_SITE_OTHER): Payer: Medicare HMO

## 2017-12-31 VITALS — BP 132/76 | HR 70 | Ht 61.75 in | Wt 135.2 lb

## 2017-12-31 DIAGNOSIS — R195 Other fecal abnormalities: Secondary | ICD-10-CM

## 2017-12-31 DIAGNOSIS — R634 Abnormal weight loss: Secondary | ICD-10-CM

## 2017-12-31 DIAGNOSIS — R1084 Generalized abdominal pain: Secondary | ICD-10-CM

## 2017-12-31 DIAGNOSIS — E739 Lactose intolerance, unspecified: Secondary | ICD-10-CM | POA: Diagnosis not present

## 2017-12-31 LAB — COMPREHENSIVE METABOLIC PANEL
ALBUMIN: 4.4 g/dL (ref 3.5–5.2)
ALK PHOS: 72 U/L (ref 39–117)
ALT: 21 U/L (ref 0–35)
AST: 22 U/L (ref 0–37)
BILIRUBIN TOTAL: 0.5 mg/dL (ref 0.2–1.2)
BUN: 15 mg/dL (ref 6–23)
CALCIUM: 9.6 mg/dL (ref 8.4–10.5)
CO2: 29 mEq/L (ref 19–32)
CREATININE: 1.05 mg/dL (ref 0.40–1.20)
Chloride: 108 mEq/L (ref 96–112)
GFR: 64.77 mL/min (ref >60.00)
Glucose, Bld: 98 mg/dL (ref 70–99)
Potassium: 4 mEq/L (ref 3.5–5.1)
Sodium: 143 mEq/L (ref 135–145)
TOTAL PROTEIN: 7.5 g/dL (ref 6.0–8.3)

## 2017-12-31 LAB — CBC WITH DIFFERENTIAL/PLATELET
BASOS PCT: 0.6 % (ref 0.0–3.0)
Basophils Absolute: 0 10*3/uL (ref 0.0–0.1)
EOS PCT: 4.2 % (ref 0.0–5.0)
Eosinophils Absolute: 0.3 10*3/uL (ref 0.0–0.7)
HCT: 42 % (ref 36.0–46.0)
Hemoglobin: 14.1 g/dL (ref 12.0–15.0)
LYMPHS PCT: 25 % (ref 12.0–46.0)
Lymphs Abs: 1.8 10*3/uL (ref 0.7–4.0)
MCHC: 33.6 g/dL (ref 30.0–36.0)
MCV: 84.8 fl (ref 78.0–100.0)
MONO ABS: 1 10*3/uL (ref 0.1–1.0)
MONOS PCT: 14.2 % — AB (ref 3.0–12.0)
NEUTROS ABS: 4 10*3/uL (ref 1.4–7.7)
Neutrophils Relative %: 56 % (ref 43.0–77.0)
PLATELETS: 247 10*3/uL (ref 150.0–400.0)
RBC: 4.96 Mil/uL (ref 3.87–5.11)
RDW: 14.4 % (ref 11.5–15.5)
WBC: 7.1 10*3/uL (ref 4.0–10.5)

## 2017-12-31 NOTE — Patient Instructions (Signed)
  Your provider has requested that you go to the basement level for lab work before leaving today. Press "B" on the elevator. The lab is located at the first door on the left as you exit the elevator.   You have been scheduled for a CT scan of the abdomen and pelvis at [place] ([address]---this is in the same building as Press photographer).   You are scheduled on 01/09/18 at 3:00pm. You should arrive 15 minutes prior to your appointment time for registration. Please follow the written instructions below on the day of your exam:  WARNING: IF YOU ARE ALLERGIC TO IODINE/X-RAY DYE, PLEASE NOTIFY RADIOLOGY IMMEDIATELY AT [phone]! YOU WILL BE GIVEN A 13 HOUR PREMEDICATION PREP.  1) Do not eat or drink anything after 11:00AM (4 hours prior to your test) 2) You have been given 2 bottles of oral contrast to drink. The solution may taste better if refrigerated, but do NOT add ice or any other liquid to this solution. Shake well before drinking.    Drink 1 bottle of contrast @ 1:00PM (2 hours prior to your exam)  Drink 1 bottle of contrast @ 2:00PM (1 hour prior to your exam)  You may take any medications as prescribed with a small amount of water, if necessary. If you take any of the following medications: METFORMIN, GLUCOPHAGE, GLUCOVANCE, AVANDAMET, RIOMET, FORTAMET, [place] MET, JANUMET, GLUMETZA or METAGLIP, you MAY be asked to HOLD this medication 48 hours AFTER the exam.  The purpose of you drinking the oral contrast is to aid in the visualization of your intestinal tract. The contrast solution may cause some diarrhea. Depending on your individual set of symptoms, you may also receive an intravenous injection of x-ray contrast/dye. Plan on being at [facility] for 30 minutes or longer, depending on the type of exam you are having performed.  This test typically takes 30-45 minutes to complete.  If you have any questions regarding your exam or if you need to  reschedule, you may call the CT department at [phone] between the hours of 8:00 am and 5:00 pm, Monday-Friday.  ________________________________________________________________________  I appreciate the opportunity to care for you. Silvano Rusk, MD, [facility]

## 2017-12-31 NOTE — Progress Notes (Addendum)
Diana Hurley 82 y.o. Dec 21, 1935 [id number]  Assessment & Plan:   Encounter Diagnoses  Name Primary?  . Generalized abdominal pain Yes  . Loose stools   . Loss of weight    4#weight loss is small but she is small to begin with.  She is worried about the possibility of pancreatic cancer she is having pain.  Will evaluate with cross-sectional imaging, CT of the abdomen and pelvis.  Hx + lactoferrin Hx suggests IBS but she could have an inflammatory situation.  May need to repeat a colonoscopy.  Last in 2015.   I appreciate the opportunity to care for this patient. CC: Derinda Late, MD   Subjective:   Chief Complaint:  HPI The patient is an 82 year old black woman here with complaints of mid abdominal epigastric type pain and periumbilical its worse after eating.  She is lost a little bit of weight.  She says to me - "could I have pancreatic cancer?"  Says her stools are softer. 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 Wt Readings from Last 3 Encounters:  12/31/17 135 lb 4 oz (61.3 kg)  08/26/17 137 lb 1.6 oz (62.2 kg)  07/02/17 139 lb 12.8 oz (63.4 kg)   She knows she is lactose intolerant.  Even though she avoids that she gets cramps and has to defecate and stools are soft.  I do not think she is had any incontinence and there are no clear food triggers.   Colonoscopy 2015 - tics otherwise negative EGD in February 2019 with erosive gastritis question related to medications her losartan was stopped and so was aspirin.  She was having similar pain at that time.  She had a positive H. pylori antibody but negative biopsies.  She did take pantoprazole for her gastritis previously but thinks it caused some sort of side effect and was stopped. Looking back over her work-up she was having looser stools as mentioned above and she did have a positive lactoferrin.  Earlier this year. Allergies  Allergen Reactions  . Penicillins Other (See Comments)   syncope  . Acarbose Other (See Comments)    Bloating and gas  . Ace Inhibitors Other (See Comments)    angioedema  . Fluvastatin Other (See Comments)    myalgias  . Food     Lactose intolerant  . Glipizide Other (See Comments)    hypoglycemia  . Januvia [Sitagliptin Phosphate] Other (See Comments)    myalgias  . Lovastatin Other (See Comments)    myalgias  . Pravastatin Other (See Comments)    myalgias  . Simvastatin Other (See Comments)    myalgias   Current Meds  Medication Sig  . amLODipine (NORVASC) 5 MG tablet Take 5 mg by mouth daily.    Past Medical History:  Diagnosis Date  . Allergic rhinitis   . Atrophic vaginitis   . Bradycardia   . Breast thickening    left  . DDD (degenerative disc disease) 1998   Exacerbation in 2004 w/right radicular Sx  . Diabetes mellitus, type 2 ([place]) 2007  . Diastolic dysfunction    GRADE 1   . Diverticulosis 2005   Found during colonoscopy  . DJD (degenerative joint disease)   . DOE (dyspnea on exertion) 03/01/12   Sx have increased over past several months, referral to [facility]  . Elevated liver enzymes 2001   Secondary to a gallstone & fatty filtration of the liver  . Essential hypertension, benign   . Fatty infiltration  of liver 2001  . Gastritis   . Hypercholesterolemia 2003  . Hyperlipidemia   . Hypertension   . Lactose intolerance   . Mitral regurgitation   . Muscle cramps    In hands and thighs  . Musculoskeletal chest pain   . Osteopenia 2000  . Osteoporosis 06/2015   Right Hip  . Palpitations    recurrent  . PSVT (paroxysmal supraventricular tachycardia) ([place]) 2011  . PVC's (premature ventricular contractions) 2011  . Shortness of breath   . Sickle cell trait ([place])   . Venous insufficiency 2009  . Vertigo   . Vitamin D deficiency 2010   Past Surgical History:  Procedure Laterality Date  . CHOLECYSTECTOMY  2006  . COLONOSCOPY    . ESOPHAGOGASTRODUODENOSCOPY  03/2017   gastritis  . TOTAL ABDOMINAL  HYSTERECTOMY  1982   for fibroids   Social History   Social History Narrative   Married   3 children   Retired   No alcohol, tobacco, drugs   family history includes Breast cancer (age of onset: 69) in her mother; CAD in her sister; Colon cancer in her maternal aunt and maternal uncle; Diabetes in her mother and sister; Hyperlipidemia in her brother; Hypertension in her sister, sister, sister, and sister; Kidney failure in her mother; Liver cancer (age of onset: 34) in her father; Stroke in her sister.   Review of Systems See HPI  Objective:   Physical Exam @BP  132/76   Pulse 70   Ht 5' 1.75" (1.568 m)   Wt 135 lb 4 oz (61.3 kg)   BMI 24.94 kg/m @  General:  NAD Eyes:   anicteric Lungs:  clear Heart::  S1S2 no rubs, murmurs or gallops Abdomen:  soft and nontender, BS+ Ext:   no edema, cyanosis or clubbing    Data Reviewed:  See HPI

## 2018-01-08 ENCOUNTER — Telehealth: Payer: Self-pay

## 2018-01-08 DIAGNOSIS — R195 Other fecal abnormalities: Secondary | ICD-10-CM

## 2018-01-08 DIAGNOSIS — R1084 Generalized abdominal pain: Secondary | ICD-10-CM

## 2018-01-08 DIAGNOSIS — R634 Abnormal weight loss: Secondary | ICD-10-CM

## 2018-01-08 NOTE — Telephone Encounter (Signed)
-----   Message from Gatha Mayer, MD sent at 01/08/2018  2:25 PM EST ----- Regarding: RE: CT pending denial Have her do a complete abdominal ultrasound first ----- Message ----- From: [place], Sirenity Shew E, CMA Sent: 01/08/2018  11:21 AM EST To: Gatha Mayer, MD Subject: FW: CT pending denial                            ----- Message ----- From: Darden Dates Sent: 01/08/2018  11:02 AM EST To: Osvaldo Shipper, NT, Zylee Marchiano E [place], CMA, # Subject: CT pending denial                              Dr. Carlean Purl can do peer to peer at this point.  Will need to set up appt for him to talk to dr there. Right now she needs ultrasound first, unless he can get auth over the phone with dr. [phone] Case# [id number] [facility]

## 2018-01-08 NOTE — Telephone Encounter (Signed)
Patient informed of the change in testing and I've left Stacy a message to cancel tomorrow's CT. I will order the U/S for after thanksgiving as she has requested.

## 2018-01-08 NOTE — Telephone Encounter (Signed)
Patient informed of U/S appointment: 01/19/18 at 9:00 AM, arrive at 8:40 AM, NPO midnight. To be done at [facility], [place] [address]

## 2018-01-09 ENCOUNTER — Inpatient Hospital Stay: Admission: RE | Admit: 2018-01-09 | Payer: Medicare HMO | Source: Ambulatory Visit

## 2018-01-11 NOTE — Progress Notes (Signed)
Please let her know that labs are ok Await [place]

## 2018-01-19 ENCOUNTER — Ambulatory Visit
Admission: RE | Admit: 2018-01-19 | Discharge: 2018-01-19 | Disposition: A | Discharge status: Home / Self Care | Payer: Medicare HMO | Source: Ambulatory Visit | Attending: Internal Medicine | Admitting: Internal Medicine

## 2018-01-19 DIAGNOSIS — R1084 Generalized abdominal pain: Secondary | ICD-10-CM

## 2018-01-19 DIAGNOSIS — R195 Other fecal abnormalities: Secondary | ICD-10-CM

## 2018-01-19 DIAGNOSIS — R634 Abnormal weight loss: Secondary | ICD-10-CM

## 2018-01-20 NOTE — Progress Notes (Signed)
This looks ok Some fatty l;iver but that is common and not causing problems in her I think  If she is still having toe epigastric pain (and weight loss) would now do CT abdomen with contrast  We were going to do before but had to do an [place] first

## 2018-01-21 NOTE — Progress Notes (Signed)
I suggest we watch things for now  I suggest she see me again in about 2 months to check on things and she should call back sooner prn

## 2018-04-16 ENCOUNTER — Encounter (INDEPENDENT_AMBULATORY_CARE_PROVIDER_SITE_OTHER): Payer: Self-pay

## 2018-04-16 ENCOUNTER — Ambulatory Visit (INDEPENDENT_AMBULATORY_CARE_PROVIDER_SITE_OTHER): Payer: Medicare HMO | Admitting: Internal Medicine

## 2018-04-16 ENCOUNTER — Encounter: Payer: Self-pay | Admitting: Internal Medicine

## 2018-04-16 VITALS — BP 158/76 | HR 72 | Ht 61.75 in | Wt 135.0 lb

## 2018-04-16 DIAGNOSIS — R194 Change in bowel habit: Secondary | ICD-10-CM | POA: Diagnosis not present

## 2018-04-16 DIAGNOSIS — R140 Abdominal distension (gaseous): Secondary | ICD-10-CM | POA: Diagnosis not present

## 2018-04-16 DIAGNOSIS — R1033 Periumbilical pain: Secondary | ICD-10-CM | POA: Diagnosis not present

## 2018-04-16 NOTE — Patient Instructions (Signed)
You have been scheduled for a colonoscopy. Please follow written instructions given to you at your visit today.  Please pick up your prep supplies at the pharmacy within the next 1-3 days. If you use inhalers (even only as needed), please bring them with you on the day of your procedure.   I appreciate the opportunity to care for you. Carl Gessner, MD, FACG 

## 2018-04-16 NOTE — Progress Notes (Signed)
Diana Hurley 83 y.o. April 09, 1935 [id number]  Assessment & Plan:   Encounter Diagnoses  Name Primary?  . Change in bowel habits Yes  . Periumbilical abdominal pain   . Bloating    Persistent problems. At this point we have decided to so a colonoscopy to look for inflammatory or neoplastic conditions that might cause these signs/sxs (did have + lactoferrin 2019).  Probably IBS (? SIBO)  I think the colonoscopy will find a problem or if not then hopefully alleviate her significant worry about these issues. The risks and benefits as well as alternatives of endoscopic procedure(s) have been discussed and reviewed. All questions answered. The patient agrees to proceed.   I appreciate the opportunity to care for her. Cc;Blomgren, Peter, MD  Subjective:   Chief Complaint: change in bowel habits  HPI Diana Hurley is here - seen in Nov w/ hx slight wgt loss, abdominal pain and + lactoferrin and softer stools. US abdomen w/o significant problems. CBC and LFT NL. She is very concerned she could have colon cancer. Sxs not entirely consistent but she has smaller and softer stools w/o bleeding, bloating and periumbilical/general abdominal pain (mild). Sleeps ok. Colonoscopy 2015 - diverticulosis Wt Readings from Last 3 Encounters:  04/16/18 135 lb (61.2 kg)  12/31/17 135 lb 4 oz (61.3 kg)  08/26/17 137 lb 1.6 oz (62.2 kg)    Allergies  Allergen Reactions  . Penicillins Other (See Comments)    syncope  . Acarbose Other (See Comments)    Bloating and gas  . Ace Inhibitors Other (See Comments)    angioedema  . Fluvastatin Other (See Comments)    myalgias  . Food     Lactose intolerant  . Glipizide Other (See Comments)    hypoglycemia  . Januvia [Sitagliptin Phosphate] Other (See Comments)    myalgias  . Lovastatin Other (See Comments)    myalgias  . Pravastatin Other (See Comments)    myalgias  . Simvastatin Other (See Comments)    myalgias   Current Meds  Medication  Sig  . Multiple Vitamins-Minerals (MULTIPLE VITAMINS/WOMENS PO) Take by mouth daily.   Past Medical History:  Diagnosis Date  . Allergic rhinitis   . Atrophic vaginitis   . Bradycardia   . Breast thickening    left  . DDD (degenerative disc disease) 1998   Exacerbation in 2004 w/right radicular Sx  . Diabetes mellitus, type 2 ([place]) 2007  . Diastolic dysfunction    GRADE 1   . Diverticulosis 2005   Found during colonoscopy  . DJD (degenerative joint disease)   . DOE (dyspnea on exertion) 03/01/12   Sx have increased over past several months, referral to [facility]  . Elevated liver enzymes 2001   Secondary to a gallstone & fatty filtration of the liver  . Essential hypertension, benign   . Fatty infiltration of liver 2001  . Gastritis   . Hypercholesterolemia 2003  . Hyperlipidemia   . Hypertension   . Lactose intolerance   . Mitral regurgitation   . Muscle cramps    In hands and thighs  . Musculoskeletal chest pain   . Osteopenia 2000  . Osteoporosis 06/2015   Right Hip  . Palpitations    recurrent  . PSVT (paroxysmal supraventricular tachycardia) ([place]) 2011  . PVC's (premature ventricular contractions) 2011  . Shortness of breath   . Sickle cell trait ([place])   . Venous insufficiency 2009  . Vertigo   . Vitamin D deficiency 2010  Past Surgical History:  Procedure Laterality Date  . CHOLECYSTECTOMY  2006  . COLONOSCOPY    . ESOPHAGOGASTRODUODENOSCOPY  03/2017   gastritis  . TOTAL ABDOMINAL HYSTERECTOMY  1982   for fibroids   Social History   Social History Narrative   Married   3 children   Retired   No alcohol, tobacco, drugs   family history includes Breast cancer (age of onset: 72) in her mother; CAD in her sister; Colon cancer in her maternal aunt and maternal uncle; Diabetes in her mother and sister; Hyperlipidemia in her brother; Hypertension in her sister, sister, sister, and sister; Kidney failure in her mother; Liver cancer (age of onset: 39) in  her father; Stroke in her sister.   Review of Systems As per HPI  Objective:   Physical Exam BP (!) 158/76   Pulse 72   Ht 5' 1.75" (1.568 m)   Wt 135 lb (61.2 kg)   BMI 24.89 kg/m  Eyes anicteric Lungs CTA Cor NL Abd soft and NT

## 2018-04-23 ENCOUNTER — Other Ambulatory Visit: Payer: Self-pay

## 2018-04-23 ENCOUNTER — Encounter: Payer: Self-pay | Admitting: Internal Medicine

## 2018-04-23 ENCOUNTER — Ambulatory Visit (AMBULATORY_SURGERY_CENTER): Payer: Medicare HMO | Admitting: Internal Medicine

## 2018-04-23 VITALS — BP 141/60 | HR 49 | Temp 97.8°F | Resp 18 | Ht 61.75 in | Wt 135.0 lb

## 2018-04-23 DIAGNOSIS — R194 Change in bowel habit: Secondary | ICD-10-CM

## 2018-04-23 DIAGNOSIS — D128 Benign neoplasm of rectum: Secondary | ICD-10-CM

## 2018-04-23 DIAGNOSIS — K5730 Diverticulosis of large intestine without perforation or abscess without bleeding: Secondary | ICD-10-CM | POA: Diagnosis present

## 2018-04-23 MED ORDER — SODIUM CHLORIDE 0.9 % IV SOLN: 500.0000 mL | Freq: Once | INTRAVENOUS | Status: DC

## 2018-04-23 NOTE — Patient Instructions (Addendum)
I found and removed one tiny polyp that I feel certain is benign. It will be analyzed and I will let you know.  Also see diverticulosis.  No cancer, no inflammation.  I suggest you try 1 tablespoon of Benefiber daily and take a probiotic daily. Let's see if that does not help with the bowel changes.  If that fails to help satisfactorily let me know.  I appreciate the opportunity to care for you. Gatha Mayer, MD, FACG  YOU HAD AN ENDOSCOPIC PROCEDURE TODAY AT [facility]:   Refer to the procedure report that was given to you for any specific questions about what was found during the examination.  If the procedure report does not answer your questions, please call your gastroenterologist to clarify.  If you requested that your care partner not be given the details of your procedure findings, then the procedure report has been included in a sealed envelope for you to review at your convenience later.  YOU SHOULD EXPECT: Some feelings of bloating in the abdomen. Passage of more gas than usual.  Walking can help get rid of the air that was put into your GI tract during the procedure and reduce the bloating. If you had a lower endoscopy (such as a colonoscopy or flexible sigmoidoscopy) you may notice spotting of blood in your stool or on the toilet paper. If you underwent a bowel prep for your procedure, you may not have a normal bowel movement for a few days.  Please Note:  You might notice some irritation and congestion in your nose or some drainage.  This is from the oxygen used during your procedure.  There is no need for concern and it should clear up in a day or so.  SYMPTOMS TO REPORT IMMEDIATELY:   Following lower endoscopy (colonoscopy or flexible sigmoidoscopy):  Excessive amounts of blood in the stool  Significant tenderness or worsening of abdominal pains  Swelling of the abdomen that is new, acute  Fever of 100F or higher   For urgent or emergent  issues, a gastroenterologist can be reached at any hour by calling [phone].   DIET:  We do recommend a small meal at first, but then you may proceed to your regular diet.  Drink plenty of fluids but you should avoid alcoholic beverages for 24 hours.  ACTIVITY:  You should plan to take it easy for the rest of today and you should NOT DRIVE or use heavy machinery until tomorrow (because of the sedation medicines used during the test).    FOLLOW UP: Our staff will call the number listed on your records the next business day following your procedure to check on you and address any questions or concerns that you may have regarding the information given to you following your procedure. If we do not reach you, we will leave a message.  However, if you are feeling well and you are not experiencing any problems, there is no need to return our call.  We will assume that you have returned to your regular daily activities without incident.  If any biopsies were taken you will be contacted by phone or by letter within the next 1-3 weeks.  Please call us at [phone] if you have not heard about the biopsies in 3 weeks.    SIGNATURES/CONFIDENTIALITY: You and/or your care partner have signed paperwork which will be entered into your electronic medical record.  These signatures attest to the fact that that the information  above on your After Visit Summary has been reviewed and is understood.  Full responsibility of the confidentiality of this discharge information lies with you and/or your care-partner.  Read all handouts given to you by your recovery room nurse.

## 2018-04-23 NOTE — Progress Notes (Signed)
Report given to PACU, vss 

## 2018-04-23 NOTE — Op Note (Signed)
[facility] Patient Name: Omara Alcon Procedure Date: 04/23/2018 1:20 PM MRN: [id number] Endoscopist: Gatha Mayer , MD Age: 83 Referring MD:  Date of Birth: [birth year] Gender: Female Account #: [id number] Procedure:                Colonoscopy Indications:              Change in bowel habits, + lactoferrin Medicines:                Propofol per Anesthesia, Monitored Anesthesia Care Procedure:                Pre-Anesthesia Assessment:                           - Prior to the procedure, a History and Physical                            was performed, and patient medications and                            allergies were reviewed. The patient's tolerance of                            previous anesthesia was also reviewed. The risks                            and benefits of the procedure and the sedation                            options and risks were discussed with the patient.                            All questions were answered, and informed consent                            was obtained. Prior Anticoagulants: The patient has                            taken no previous anticoagulant or antiplatelet                            agents. ASA Grade Assessment: II - A patient with                            mild systemic disease. After reviewing the risks                            and benefits, the patient was deemed in                            satisfactory condition to undergo the procedure.                           After obtaining informed consent, the colonoscope  was passed under direct vision. Throughout the                            procedure, the patient's blood pressure, pulse, and                            oxygen saturations were monitored continuously. The                            Colonoscope was introduced through the anus and                            advanced to the the cecum, identified by   appendiceal orifice and ileocecal valve. The                            colonoscopy was performed without difficulty. The                            patient tolerated the procedure well. The quality                            of the bowel preparation was good. The ileocecal                            valve, appendiceal orifice, and rectum were                            photographed. The bowel preparation used was                            Miralax. Scope In: 1:40:27 PM Scope Out: 1:54:43 PM Scope Withdrawal Time: 0 hours 11 minutes 27 seconds  Total Procedure Duration: 0 hours 14 minutes 16 seconds  Findings:                 The perianal and digital rectal examinations were                            normal.                           A diminutive polyp was found in the rectum. The                            polyp was sessile. The polyp was removed with a                            cold snare. Resection and retrieval were complete.                            Verification of patient identification for the                            specimen was done. Estimated blood loss was minimal.  Multiple diverticula were found in the sigmoid                            colon.                           The exam was otherwise without abnormality on                            direct and retroflexion views. Complications:            No immediate complications. Estimated Blood Loss:     Estimated blood loss was minimal. Impression:               - One diminutive polyp in the rectum, removed with                            a cold snare. Resected and retrieved.                           - Diverticulosis in the sigmoid colon.                           - The examination was otherwise normal on direct                            and retroflexion views. Recommendation:           - Patient has a contact number available for                            emergencies. The signs and symptoms  of potential                            delayed complications were discussed with the                            patient. Return to normal activities tomorrow.                            Written discharge instructions were provided to the                            patient.                           - Resume previous diet.                           - Continue present medications. Try Benefiber 1                            tbsp daily and a probiotic daily to see if that                            firms up bowel movements.                           -  No repeat colonoscopy due to age. Gatha Mayer, MD 04/23/2018 2:05:46 PM This report has been signed electronically.

## 2018-04-28 ENCOUNTER — Encounter: Payer: Self-pay | Admitting: Internal Medicine

## 2018-04-28 NOTE — Progress Notes (Signed)
Diminutive adenoma No recall at age 83-

## 2018-08-27 ENCOUNTER — Other Ambulatory Visit: Payer: Self-pay

## 2018-08-31 ENCOUNTER — Ambulatory Visit: Payer: Medicare HMO | Admitting: Obstetrics and Gynecology

## 2018-08-31 ENCOUNTER — Other Ambulatory Visit: Payer: Self-pay

## 2018-08-31 ENCOUNTER — Encounter: Payer: Self-pay | Admitting: Obstetrics and Gynecology

## 2018-08-31 ENCOUNTER — Ambulatory Visit (INDEPENDENT_AMBULATORY_CARE_PROVIDER_SITE_OTHER): Payer: Medicare HMO | Admitting: Obstetrics and Gynecology

## 2018-08-31 VITALS — BP 150/72 | HR 72 | Temp 98.2°F | Resp 14 | Ht 61.75 in | Wt 137.0 lb

## 2018-08-31 DIAGNOSIS — N763 Subacute and chronic vulvitis: Secondary | ICD-10-CM

## 2018-08-31 DIAGNOSIS — Z01419 Encounter for gynecological examination (general) (routine) without abnormal findings: Secondary | ICD-10-CM | POA: Diagnosis not present

## 2018-08-31 MED ORDER — TRIAMCINOLONE ACETONIDE 0.025 % EX OINT: 1.0000 "application " | TOPICAL_OINTMENT | Freq: Two times a day (BID) | CUTANEOUS | 1 refills | Status: DC

## 2018-08-31 NOTE — Progress Notes (Signed)
83 y.o. G83P2003 Married [place] American female here for annual exam.  Patient states that she has some dryness and irritation at times.  Can itch.  No discharge or odor.   Bothers her in her lower abdomen "like she is getting an infection." Denies dysuria today.  States she is having stomach problems.  She has upper abdominal pain when she eats.  After having a BM, she feels better.  She had an abdominal ultrasound done which showed steatosis and 2.2 cm left renal cyst.   Some bladder control issues.  She has some urgency only when she has arrived in the bathroom.  No day time frequency.  Rarely gets up at night. No leak with cough, sneeze, or laugh.  She will see her PCP this week.  She had blood work done last week.   PCP:   Derinda Late   No LMP recorded. Patient has had a hysterectomy.           Sexually active: Yes.    The current method of family planning is status post hysterectomy -- Ovaries remain.    Exercising: Yes.    4 Miles every day.  Smoker:  no  Health Maintenance: Pap:  07/21/14 Negative History of abnormal Pap:  no MMG:  07/15/17 BIRADS 2 benign/density b.  She will do in August or Sept. 2020. Colonoscopy:  04/23/18 Polyps removed BMD:   07/15/17  Result  Osteopenia TDaP:  2016 HIV and Hep C: years ago Screening Labs:  PCP   reports that she has never smoked. She has never used smokeless tobacco. She reports that she does not drink alcohol or use drugs.  Past Medical History:  Diagnosis Date  . Allergic rhinitis   . Atrophic vaginitis   . Bradycardia   . Breast thickening    left  . DDD (degenerative disc disease) 1998   Exacerbation in 2004 w/right radicular Sx  . Diabetes mellitus, type 2 ([place]) 2007  . Diastolic dysfunction    GRADE 1   . Diverticulosis 2005   Found during colonoscopy  . DJD (degenerative joint disease)   . DOE (dyspnea on exertion) 03/01/12   Sx have increased over past several months, referral to [facility]  . Elevated  liver enzymes 2001   Secondary to a gallstone & fatty filtration of the liver  . Essential hypertension, benign   . Fatty infiltration of liver 2001  . Gastritis   . Hypercholesterolemia 2003  . Hyperlipidemia   . Hypertension   . Lactose intolerance   . Mitral regurgitation   . Muscle cramps    In hands and thighs  . Musculoskeletal chest pain   . Osteopenia 2000  . Osteoporosis 06/2015   Right Hip  . Palpitations    recurrent  . PSVT (paroxysmal supraventricular tachycardia) ([place]) 2011  . PVC's (premature ventricular contractions) 2011  . Shortness of breath   . Sickle cell trait ([place])   . Venous insufficiency 2009  . Vertigo   . Vitamin D deficiency 2010    Past Surgical History:  Procedure Laterality Date  . CHOLECYSTECTOMY  2006  . COLONOSCOPY    . ESOPHAGOGASTRODUODENOSCOPY  03/2017   gastritis  . TOTAL ABDOMINAL HYSTERECTOMY  1982   for fibroids    Current Outpatient Medications  Medication Sig Dispense Refill  . amLODipine (NORVASC) 5 MG tablet Take 5 mg by mouth daily.   0  . Multiple Vitamins-Minerals (MULTIPLE VITAMINS/WOMENS PO) Take by mouth daily.     No  current facility-administered medications for this visit.     Family History  Problem Relation Age of Onset  . Kidney failure Mother        renal failure secondary to diabetes  . Diabetes Mother   . Breast cancer Mother 67  . Liver cancer Father 48  . Hypertension Sister   . Hyperlipidemia Brother   . Hypertension Sister   . CAD Sister   . Hypertension Sister   . Diabetes Sister   . Stroke Sister   . Hypertension Sister   . Colon cancer Maternal Aunt   . Colon cancer Maternal Uncle   . Esophageal cancer Neg Hx   . Rectal cancer Neg Hx   . Stomach cancer Neg Hx     Review of Systems  All other systems reviewed and are negative.   Exam:   There were no vitals taken for this visit.    General appearance: alert, cooperative and appears stated age Head: normocephalic, without obvious  abnormality, atraumatic Neck: no adenopathy, supple, symmetrical, trachea midline and thyroid normal to inspection and palpation Lungs: clear to auscultation bilaterally Breasts: normal appearance, no masses or tenderness, No nipple retraction or dimpling, No nipple discharge or bleeding, No axillary adenopathy Heart: regular rate and rhythm Abdomen: soft, non-tender; no masses, no organomegaly Extremities: extremities normal, atraumatic, no cyanosis or edema Skin: skin color, texture, turgor normal. No rashes or lesions Lymph nodes: cervical, supraclavicular, and axillary nodes normal. Neurologic: grossly normal  Pelvic: External genitalia:  no lesions              No abnormal inguinal nodes palpated.              Urethra:  normal appearing urethra with no masses, tenderness or lesions              Bartholins and Skenes: normal                 Vagina: normal appearing vagina with normal color and discharge, no lesions              Cervix: absent              Pap taken: No. Bimanual Exam:  Uterus:  absent              Adnexa: no mass, fullness, tenderness              Rectal exam: Yes.  .  Confirms.              Anus:  normal sphincter tone, no lesions  Chaperone was present for exam.  Assessment:   Well woman visit with normal exam. Status post TAH for fibroids.  Ovaries remain.  Osteoporosis.  Off Fosamax.  PCP following.  Intermittent vulvitis.   FH breast cancer in mother.   Plan: Mammogram screening discussed. Self breast awareness reviewed. Pap and HR HPV as above. Guidelines for Calcium, Vitamin D, regular exercise program including cardiovascular and weight bearing exercise. BMD next year.  Affirm done.  Triamcinolone ointment bid prn.  Follow up annually and prn.   After visit summary provided.

## 2018-08-31 NOTE — Patient Instructions (Signed)

## 2018-09-01 LAB — VAGINITIS/VAGINOSIS, DNA PROBE
Candida Species: NEGATIVE
Gardnerella vaginalis: NEGATIVE
Trichomonas vaginosis: NEGATIVE

## 2018-09-04 ENCOUNTER — Ambulatory Visit: Payer: Medicare HMO | Admitting: Obstetrics and Gynecology

## 2018-11-19 ENCOUNTER — Encounter: Payer: Self-pay | Admitting: Obstetrics and Gynecology

## 2018-11-30 ENCOUNTER — Encounter: Payer: Self-pay | Admitting: Obstetrics and Gynecology

## 2018-11-30 ENCOUNTER — Ambulatory Visit (INDEPENDENT_AMBULATORY_CARE_PROVIDER_SITE_OTHER): Payer: Medicare HMO | Admitting: Obstetrics and Gynecology

## 2018-11-30 ENCOUNTER — Other Ambulatory Visit: Payer: Self-pay

## 2018-11-30 VITALS — BP 146/84 | HR 56 | Temp 97.4°F | Ht 61.75 in | Wt 133.8 lb

## 2018-11-30 DIAGNOSIS — N760 Acute vaginitis: Secondary | ICD-10-CM | POA: Diagnosis not present

## 2018-11-30 DIAGNOSIS — R300 Dysuria: Secondary | ICD-10-CM | POA: Diagnosis not present

## 2018-11-30 LAB — POCT URINALYSIS DIPSTICK
Bilirubin, UA: NEGATIVE
Blood, UA: NEGATIVE
Glucose, UA: NEGATIVE
Ketones, UA: NEGATIVE
Leukocytes, UA: NEGATIVE
Nitrite, UA: NEGATIVE
Protein, UA: NEGATIVE
Urobilinogen, UA: 0.2 E.U./dL
pH, UA: 5 (ref 5.0–8.0)

## 2018-11-30 NOTE — Progress Notes (Signed)
GYNECOLOGY  VISIT   HPI: 83 y.o.   Married  [place]  [place]   [phone] with No LMP recorded. Patient has had a hysterectomy.   here for burning with urination. Vaginal burning more than with urinating.  Feeling burning in the vagina for 1 week. She tried some over the counter pyridium, and it helped for a couple of days, but then returned.  No vaginal discharge or odor.  No recent abx or steroids.   She has not changed soaps or detergents.   She has been using Triamcinolone ointment and uses it rarely.  She states it felt a little bit better.   She has DM.  Thinks her A!C was 6.2 or 6.3.   States she used vaginal estrogen in the past but was told to stop due to her mother's hx of breast cancer.   Urine Dip:Neg  GYNECOLOGIC HISTORY: No LMP recorded. Patient has had a hysterectomy. Contraception: Hysterectomy--ovaries remain Menopausal hormone therapy: none Last mammogram:  07/15/17 BIRADS 2 benign/density b.  Last pap smear:  07/21/14 Negative        OB History    Gravida  2   Para  2   Term  2   Preterm  0   AB  0   Living  3     SAB  0   TAB  0   Ectopic  0   Multiple  1   Live Births  3              Patient Active Problem List   Diagnosis Date Noted  . ESSENTIAL HYPERTENSION, BENIGN 11/27/2009  . BRADYCARDIA 10/16/2009  . PALPITATIONS, RECURRENT 10/16/2009  . SHORTNESS OF BREATH 10/16/2009    Past Medical History:  Diagnosis Date  . Allergic rhinitis   . Atrophic vaginitis   . Bradycardia   . Breast thickening    left  . DDD (degenerative disc disease) 1998   Exacerbation in 2004 w/right radicular Sx  . Diabetes mellitus, type 2 ([place]) 2007  . Diastolic dysfunction    GRADE 1   . Diverticulosis 2005   Found during colonoscopy  . DJD (degenerative joint disease)   . DOE (dyspnea on exertion) 03/01/12   Sx have increased over past several months, referral to [facility]  . Elevated liver enzymes 2001   Secondary to a gallstone  & fatty filtration of the liver  . Essential hypertension, benign   . Fatty infiltration of liver 2001  . Gastritis   . Hypercholesterolemia 2003  . Hyperlipidemia   . Hypertension   . Lactose intolerance   . Mitral regurgitation   . Muscle cramps    In hands and thighs  . Musculoskeletal chest pain   . Osteopenia 2000  . Osteoporosis 06/2015   Right Hip  . Palpitations    recurrent  . PSVT (paroxysmal supraventricular tachycardia) ([place]) 2011  . PVC's (premature ventricular contractions) 2011  . Shortness of breath   . Sickle cell trait ([place])   . Venous insufficiency 2009  . Vertigo   . Vitamin D deficiency 2010    Past Surgical History:  Procedure Laterality Date  . CHOLECYSTECTOMY  2006  . COLONOSCOPY    . ESOPHAGOGASTRODUODENOSCOPY  03/2017   gastritis  . TOTAL ABDOMINAL HYSTERECTOMY  1982   for fibroids    Current Outpatient Medications  Medication Sig Dispense Refill  . amLODipine (NORVASC) 5 MG tablet Take 5 mg by mouth daily.   0  . Multiple  Vitamins-Minerals (MULTIPLE VITAMINS/WOMENS PO) Take by mouth daily.    . pantoprazole (PROTONIX) 40 MG tablet Take 1 tablet by mouth as needed.    . triamcinolone (KENALOG) 0.025 % ointment Apply 1 application topically 2 (two) times daily. 30 g 1   No current facility-administered medications for this visit.      ALLERGIES: Penicillins, Acarbose, Ace inhibitors, Fluvastatin, Food, Glipizide, Januvia [sitagliptin phosphate], Lovastatin, Metformin and related, Pravastatin, and Simvastatin  Family History  Problem Relation Age of Onset  . Kidney failure Mother        renal failure secondary to diabetes  . Diabetes Mother   . Breast cancer Mother 77  . Liver cancer Father 43  . Hypertension Sister   . Hyperlipidemia Brother   . Hypertension Sister   . CAD Sister   . Hypertension Sister   . Diabetes Sister   . Stroke Sister   . Hypertension Sister   . Colon cancer Maternal Aunt   . Colon cancer Maternal Uncle    . Esophageal cancer Neg Hx   . Rectal cancer Neg Hx   . Stomach cancer Neg Hx     Social History   Socioeconomic History  . Marital status: Married    Spouse name: Not on file  . Number of children: 3  . Years of education: Not on file  . Highest education level: Not on file  Occupational History  . Occupation: retired    Fish farm manager: RETIRED  Social Needs  . Financial resource strain: Not on file  . Food insecurity    Worry: Not on file    Inability: Not on file  . Transportation needs    Medical: Not on file    Non-medical: Not on file  Tobacco Use  . Smoking status: Never Smoker  . Smokeless tobacco: Never Used  Substance and Sexual Activity  . Alcohol use: No    Comment: Rare. Drinks about 1 glass a wine a month  . Drug use: No  . Sexual activity: Yes    Birth control/protection: Post-menopausal  Lifestyle  . Physical activity    Days per week: Not on file    Minutes per session: Not on file  . Stress: Not on file  Relationships  . Social Herbalist on phone: Not on file    Gets together: Not on file    Attends religious service: Not on file    Active member of club or organization: Not on file    Attends meetings of clubs or organizations: Not on file    Relationship status: Not on file  . Intimate partner violence    Fear of current or ex partner: Not on file    Emotionally abused: Not on file    Physically abused: Not on file    Forced sexual activity: Not on file  Other Topics Concern  . Not on file  Social History Narrative   Married   3 children   Retired   No alcohol, tobacco, drugs    Review of Systems  All other systems reviewed and are negative.   PHYSICAL EXAMINATION:    BP (!) 146/84   Pulse (!) 56   Temp (!) 97.4 F (36.3 C) (Temporal)   Ht 5' 1.75" (1.568 m)   Wt 133 lb 12.8 oz (60.7 kg)   BMI 24.67 kg/m     General appearance: alert, cooperative and appears stated age Head: Normocephalic, without obvious  abnormality, atraumatic   Pelvic: External genitalia:  no lesions              Urethra:  normal appearing urethra with no masses, tenderness or lesions              Bartholins and Skenes: normal                 Vagina: normal appearing vagina with normal color and large amount of creamy white discharge, no lesions              Cervix: absent                Bimanual Exam:  Uterus:   absent              Adnexa: no mass, fullness, tenderness             Chaperone was present for exam.  ASSESSMENT  Vaginitis.  DM.  FH breast cancer.   PLAN  Affirm taken.  We talked about the forms of vaginitis including atrophy.  I discussed vaginal estrogen as a low risk option for treating atrophy but did discuss potential effect on breast cancer.    An After Visit Summary was printed and given to the patient.  ___15____ minutes face to face time of which over 50% was spent in counseling.

## 2018-11-30 NOTE — Patient Instructions (Signed)
Vaginitis Vaginitis is a condition in which the vaginal tissue swells and becomes red (inflamed). This condition is most often caused by a change in the normal balance of bacteria and yeast that live in the vagina. This change causes an overgrowth of certain bacteria or yeast, which causes the inflammation. There are different types of vaginitis, but the most common types are:  Bacterial vaginosis.  Yeast infection (candidiasis).  Trichomoniasis vaginitis. This is a sexually transmitted disease (STD).  Viral vaginitis.  Atrophic vaginitis.  Allergic vaginitis. What are the causes? The cause of this condition depends on the type of vaginitis. It can be caused by:  Bacteria (bacterial vaginosis).  Yeast, which is a fungus (yeast infection).  A parasite (trichomoniasis vaginitis).  A virus (viral vaginitis).  Low hormone levels (atrophic vaginitis). Low hormone levels can occur during pregnancy, breastfeeding, or after menopause.  Irritants, such as bubble baths, scented tampons, and feminine sprays (allergic vaginitis). Other factors can change the normal balance of the yeast and bacteria that live in the vagina. These include:  Antibiotic medicines.  Poor hygiene.  Diaphragms, vaginal sponges, spermicides, birth control pills, and intrauterine devices (IUD).  Sex.  Infection.  Uncontrolled diabetes.  A weakened defense (immune) system. What increases the risk? This condition is more likely to develop in women who:  Smoke.  Use vaginal douches, scented tampons, or scented sanitary pads.  Wear tight-fitting pants.  Wear thong underwear.  Use oral birth control pills or an IUD.  Have sex without a condom.  Have multiple sex partners.  Have an STD.  Frequently use the spermicide nonoxynol-9.  Eat lots of foods high in sugar.  Have uncontrolled diabetes.  Have low estrogen levels.  Have a weakened immune system from an immune disorder or medical  treatment.  Are pregnant or breastfeeding. What are the signs or symptoms? Symptoms vary depending on the cause of the vaginitis. Common symptoms include:  Abnormal vaginal discharge. ? The discharge is white, gray, or yellow with bacterial vaginosis. ? The discharge is thick, white, and cheesy with a yeast infection. ? The discharge is frothy and yellow or greenish with trichomoniasis.  A bad vaginal smell. The smell is fishy with bacterial vaginosis.  Vaginal itching, pain, or swelling.  Sex that is painful.  Pain or burning when urinating. Sometimes there are no symptoms. How is this diagnosed? This condition is diagnosed based on your symptoms and medical history. A physical exam, including a pelvic exam, will also be done. You may also have other tests, including:  Tests to determine the pH level (acidity or alkalinity) of your vagina.  A whiff test, to assess the odor that results when a sample of your vaginal discharge is mixed with a potassium hydroxide solution.  Tests of vaginal fluid. A sample will be examined under a microscope. How is this treated? Treatment varies depending on the type of vaginitis you have. Your treatment may include:  Antibiotic creams or pills to treat bacterial vaginosis and trichomoniasis.  Antifungal medicines, such as vaginal creams or suppositories, to treat a yeast infection.  Medicine to ease discomfort if you have viral vaginitis. Your sexual partner should also be treated.  Estrogen delivered in a cream, pill, suppository, or vaginal ring to treat atrophic vaginitis. If vaginal dryness occurs, lubricants and moisturizing creams may help. You may need to avoid scented soaps, sprays, or douches.  Stopping use of a product that is causing allergic vaginitis. Then using a vaginal cream to treat the symptoms. Follow   these instructions at home: Lifestyle  Keep your genital area clean and dry. Avoid soap, and only rinse the area with  water.  Do not douche or use tampons until your health care provider says it is okay to do so. Use sanitary pads, if needed.  Do not have sex until your health care provider approves. When you can return to sex, practice safe sex and use condoms.  Wipe from front to back. This avoids the spread of bacteria from the rectum to the vagina. General instructions  Take over-the-counter and prescription medicines only as told by your health care provider.  If you were prescribed an antibiotic medicine, take or use it as told by your health care provider. Do not stop taking or using the antibiotic even if you start to feel better.  Keep all follow-up visits as told by your health care provider. This is important. How is this prevented?  Use mild, non-scented products. Do not use things that can irritate the vagina, such as fabric softeners. Avoid the following products if they are scented: ? Feminine sprays. ? Detergents. ? Tampons. ? Feminine hygiene products. ? Soaps or bubble baths.  Let air reach your genital area. ? Wear cotton underwear to reduce moisture buildup. ? Avoid wearing underwear while you sleep. ? Avoid wearing tight pants and underwear or nylons without a cotton panel. ? Avoid wearing thong underwear.  Take off any wet clothing, such as bathing suits, as soon as possible.  Practice safe sex and use condoms. Contact a health care provider if:  You have abdominal pain.  You have a fever.  You have symptoms that last for more than 2-3 days. Get help right away if:  You have a fever and your symptoms suddenly get worse. Summary  Vaginitis is a condition in which the vaginal tissue becomes inflamed.This condition is most often caused by a change in the normal balance of bacteria and yeast that live in the vagina.  Treatment varies depending on the type of vaginitis you have.  Do not douche, use tampons , or have sex until your health care provider approves. When  you can return to sex, practice safe sex and use condoms. This information is not intended to replace advice given to you by your health care provider. Make sure you discuss any questions you have with your health care provider. Document Released: 12/02/2006 Document Revised: 01/17/2017 Document Reviewed: 03/12/2016 Elsevier Patient Education  2020 [employer]  

## 2018-12-01 LAB — VAGINITIS/VAGINOSIS, DNA PROBE
Candida Species: NEGATIVE
Gardnerella vaginalis: NEGATIVE
Trichomonas vaginosis: NEGATIVE

## 2018-12-02 ENCOUNTER — Telehealth: Payer: Self-pay | Admitting: *Deleted

## 2018-12-02 NOTE — Telephone Encounter (Signed)
Call placed to [facility], spoke with Brittney. Last MMG 11/19/18. Will faxed report to [facility].

## 2018-12-02 NOTE — Telephone Encounter (Signed)
Patient needs to update her mammogram before I can prescribe vaginal estrogen.  It looks like her last mammogram was over 1 year ago.  If she has had it done, please get a copy for me review.  I will need her to contact the office back after the mammogram is completed so I can prescribe the estrogen then.

## 2018-12-02 NOTE — Telephone Encounter (Signed)
MMG report to Dr. Quincy Simmonds.

## 2018-12-02 NOTE — Telephone Encounter (Signed)
-----   Message from Nunzio Cobbs, MD sent at 12/02/2018 12:52 PM EDT ----- Please inform patient that her vaginitis testing is negative for infection.  She may want to try a water based lubricant for the vagina.  If she wishes to try a vaginal estrogen cream, this will also likely help her symptoms.

## 2018-12-02 NOTE — Telephone Encounter (Signed)
Notes recorded by Burnice Logan, RN on 12/02/2018 at 1:58 PM EDT  Spoke with patient, advised per Dr. Quincy Simmonds. Patient [facility]. Advised patient I will review with Dr. Quincy Simmonds and return call with recommendations. Patient verbalizes understanding and is agreeable.     Dr. Quincy Simmonds -please advise on vaginal estrogen cream RX.

## 2018-12-03 MED ORDER — ESTRADIOL 0.1 MG/GM VA CREA: TOPICAL_CREAM | VAGINAL | 0 refills | Status: DC

## 2018-12-03 NOTE — Telephone Encounter (Signed)
Mammogram dated 11/19/18 received from [place], [place], category B density rating.   Ok for Estradiol vaginal cream 0.01%, 1/2 gram pv at hs x 2 weeks and then 1/2 gram pv at hs 2 to 3 times per week.  Dispense:  42.5 grams, RF none.   Please have her do a follow up visit with me in 6 weeks for a recheck in the office.

## 2018-12-03 NOTE — Telephone Encounter (Signed)
Spoke with patient, advised as seen below per Dr. Quincy Simmonds. Rx to verified pharmacy, patient read back instructions. OV scheduled for 11/30 at 10am with Dr. Quincy Simmonds. Patient verbalizes understanding and is agreeable.   Encounter closed.

## 2018-12-25 ENCOUNTER — Ambulatory Visit: Payer: Medicare HMO | Admitting: Internal Medicine

## 2019-01-13 ENCOUNTER — Other Ambulatory Visit: Payer: Self-pay | Admitting: Obstetrics and Gynecology

## 2019-01-13 DIAGNOSIS — Z01419 Encounter for gynecological examination (general) (routine) without abnormal findings: Secondary | ICD-10-CM

## 2019-01-13 MED ORDER — ESTRADIOL 0.1 MG/GM VA CREA: TOPICAL_CREAM | VAGINAL | 0 refills | Status: DC

## 2019-01-13 NOTE — Telephone Encounter (Signed)
Spoke with pt. Pt has cancelled appt on 01/18/19. Pt states having no sx and "feels back to normal" and doesn't really want to go out right now with all the Covid" . States has some estradiol cream left but wants RF in case for later when runs out and cant get an appt.  Will route to Dr Quincy Simmonds for any additional recommendations and RF request.   Med refill request: Estradiol vaginal cream 0.1mg /gm Last AEX: 09/11/2017 Next AEX: 09/29/2019   Last MMG (if hormonal med) 11/19/2018, BIRADS 2, benign.  Refill authorized: 42.5grams/0RF, orders pended if approved.

## 2019-01-13 NOTE — Telephone Encounter (Signed)
Patient canceled her upcoming 6 week recheck appointment 01/18/19. She stated her problem has gone away and she no longer needs this appointment. She is asking for a refill of estradiol, [facility].

## 2019-01-18 ENCOUNTER — Ambulatory Visit: Payer: Self-pay | Admitting: Obstetrics and Gynecology

## 2019-01-27 IMAGING — CR DG SHOULDER 2+V*L*
3 series · 3 of 3 positions shown · non-contrast
Comparison: 05/25/2013

CLINICAL DATA: LEFT shoulder pain for 3 weeks after beginning to
lifts weights

EXAM:
LEFT SHOULDER - 2+ VIEW

[w shoulder grashey left]
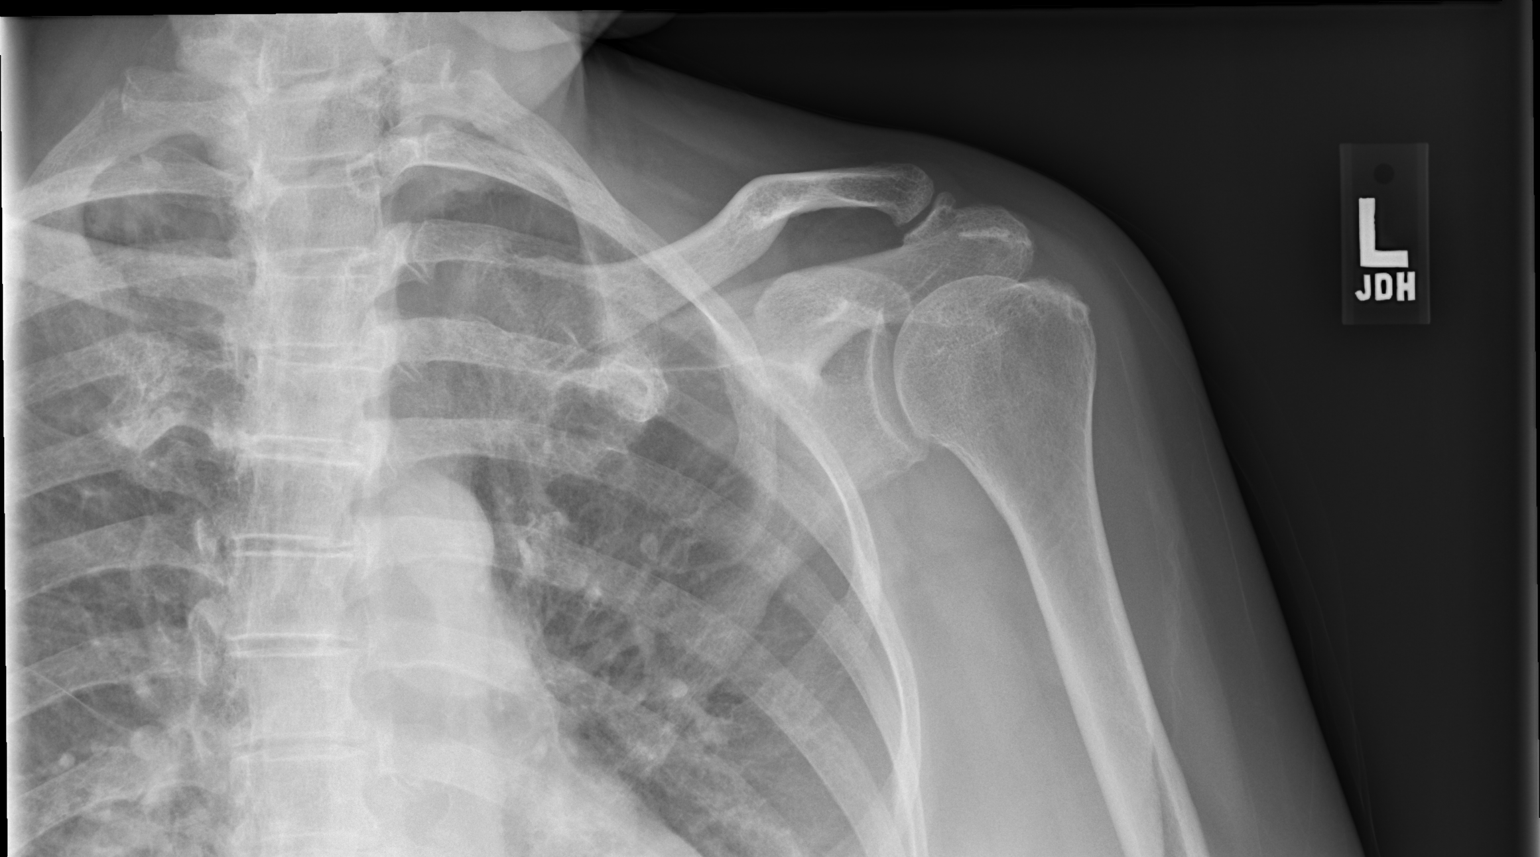

[w shoulder y-view left]
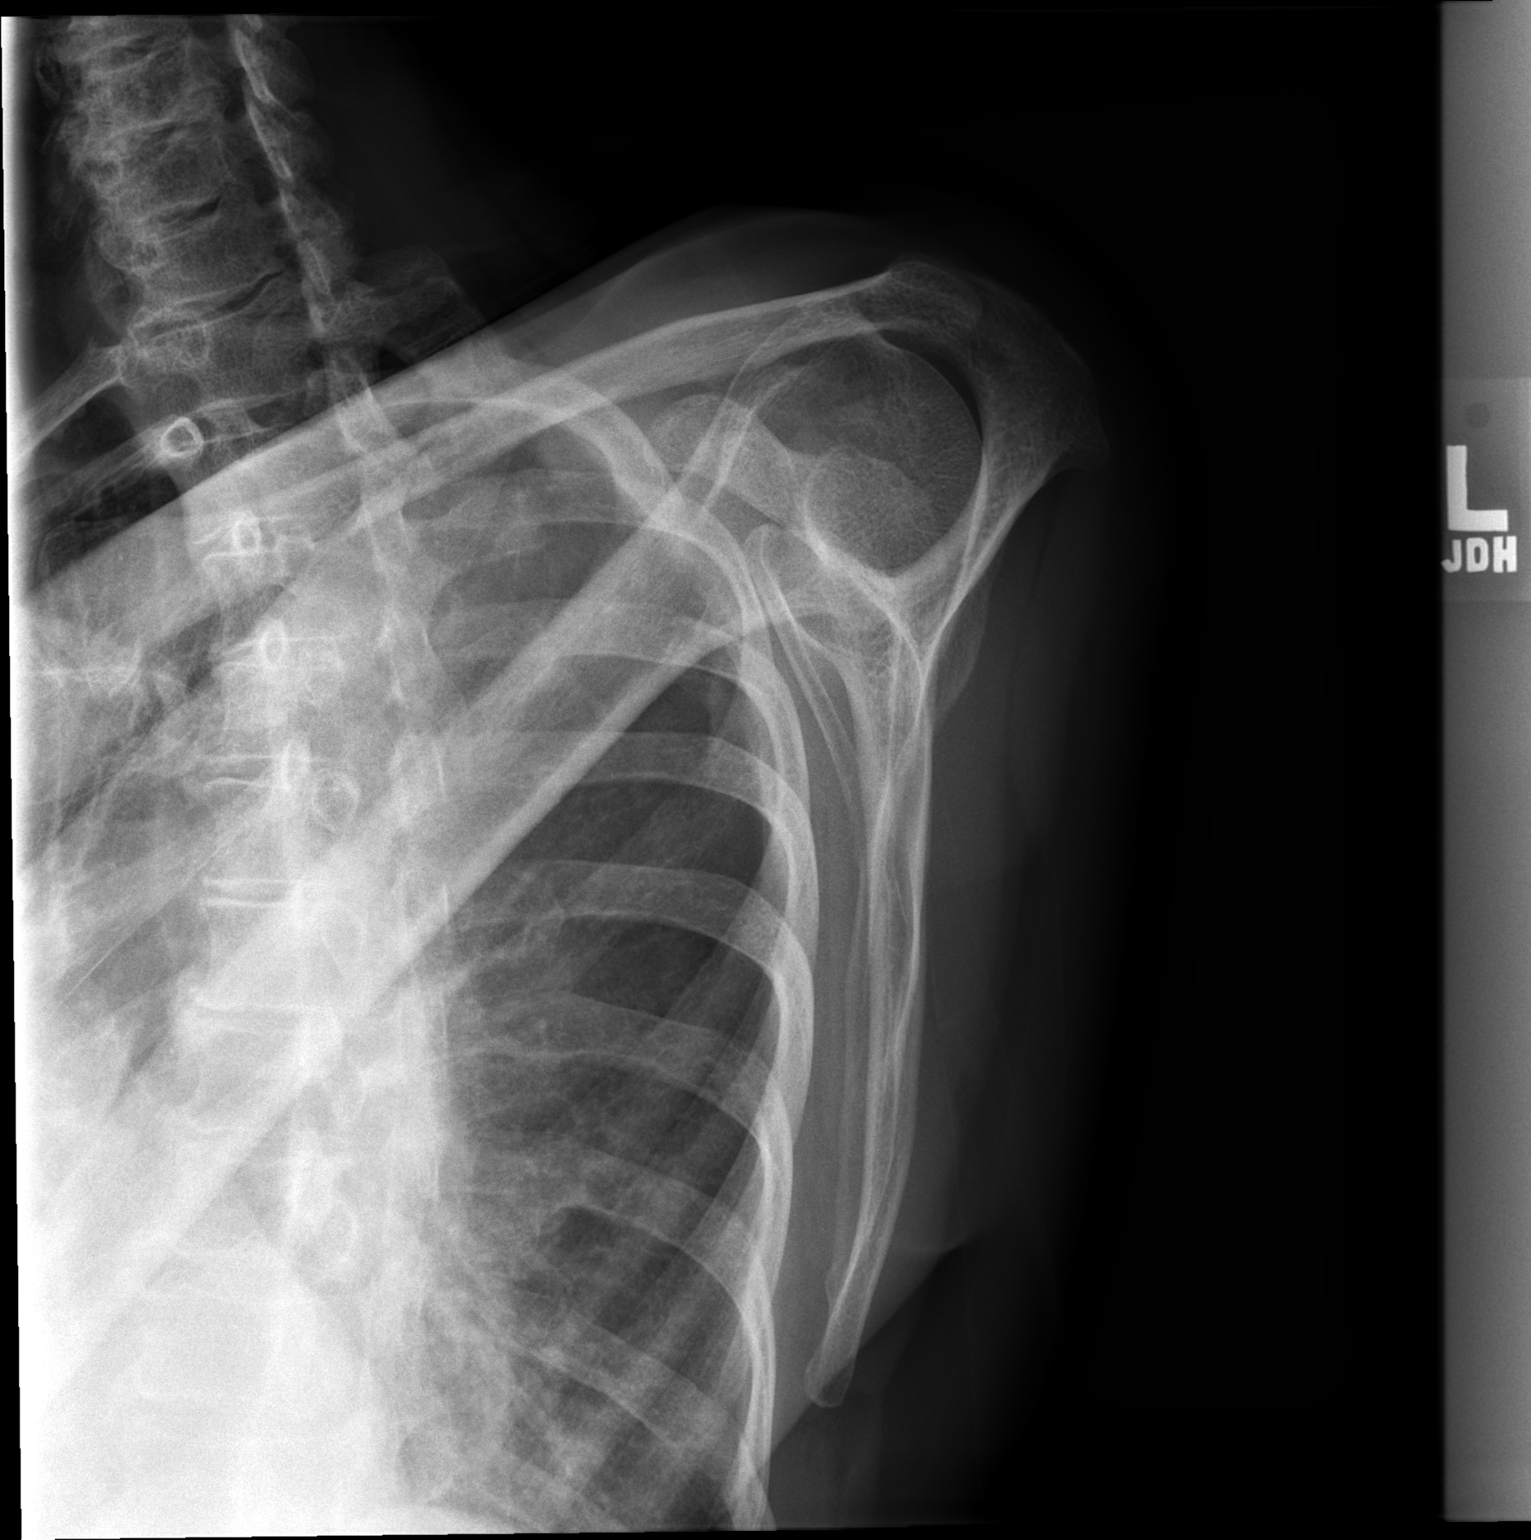

[w shoulder axillary left]
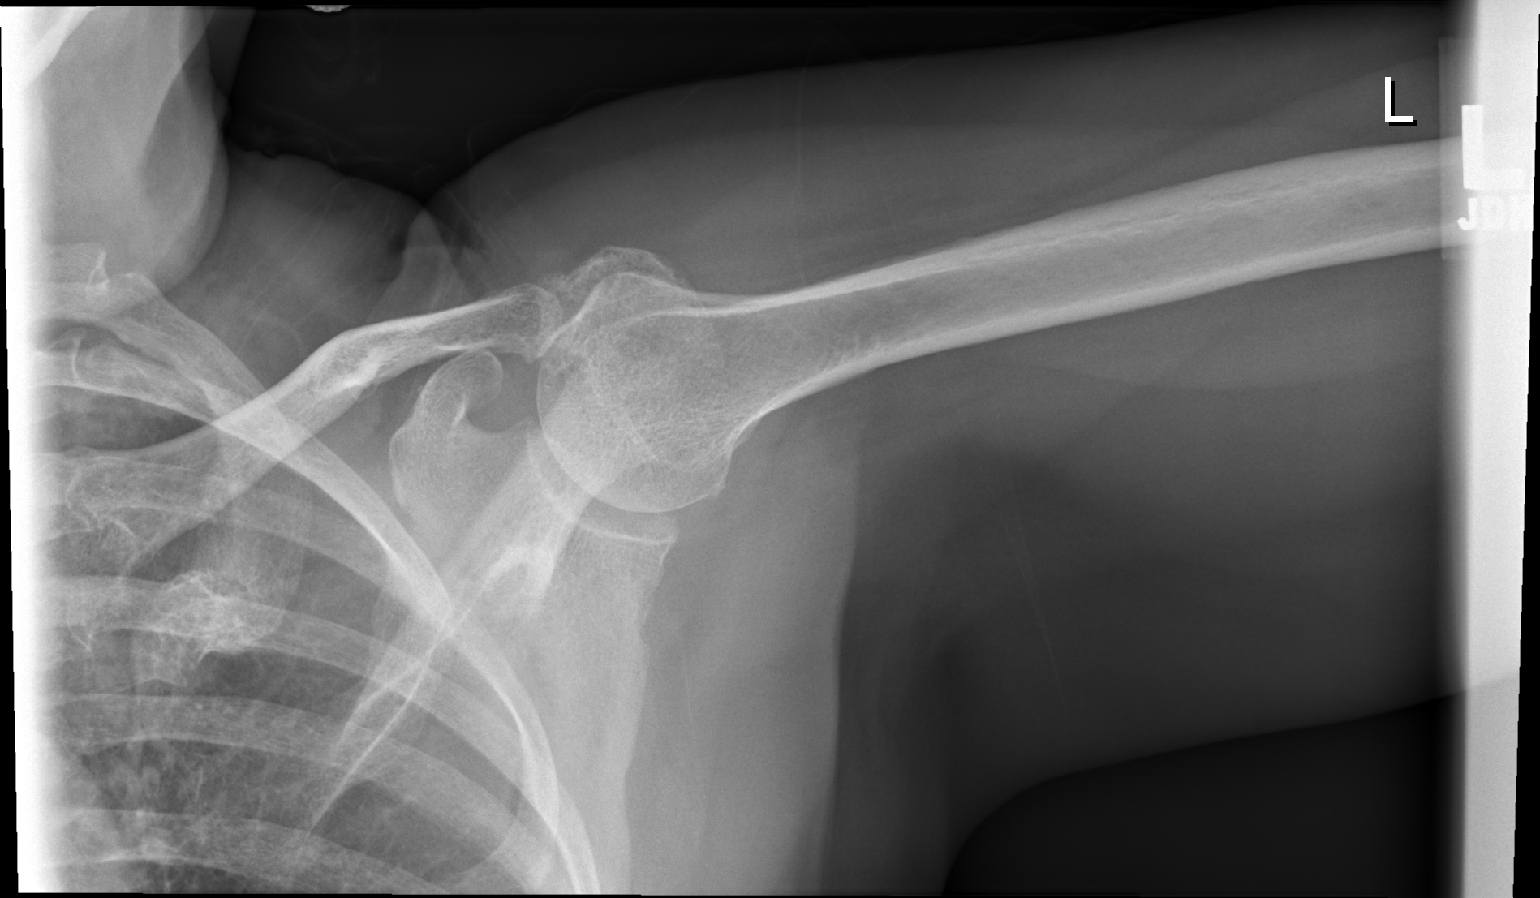

[3 of 3 positions shown; findings below may reference images not displayed]

FINDINGS: Osseous mineralization normal.

LEFT AC joint degenerative changes.

No acute fracture, dislocation or bone destruction. Visualized ribs
intact.
IMPRESSION: LEFT AC joint degenerative changes.

No acute abnormalities.

## 2019-04-14 ENCOUNTER — Other Ambulatory Visit: Payer: Self-pay

## 2019-04-14 ENCOUNTER — Encounter (HOSPITAL_COMMUNITY): Payer: Self-pay

## 2019-04-14 ENCOUNTER — Emergency Department (HOSPITAL_COMMUNITY)
Admission: EM | Admit: 2019-04-14 | Discharge: 2019-04-14 | Disposition: A | Discharge status: Home / Self Care | Payer: Medicare HMO | Attending: Emergency Medicine | Admitting: Emergency Medicine

## 2019-04-14 DIAGNOSIS — R531 Weakness: Secondary | ICD-10-CM | POA: Diagnosis not present

## 2019-04-14 DIAGNOSIS — Z79899 Other long term (current) drug therapy: Secondary | ICD-10-CM | POA: Insufficient documentation

## 2019-04-14 DIAGNOSIS — E119 Type 2 diabetes mellitus without complications: Secondary | ICD-10-CM | POA: Insufficient documentation

## 2019-04-14 DIAGNOSIS — R42 Dizziness and giddiness: Secondary | ICD-10-CM | POA: Diagnosis present

## 2019-04-14 DIAGNOSIS — I10 Essential (primary) hypertension: Secondary | ICD-10-CM | POA: Insufficient documentation

## 2019-04-14 LAB — CBC
HCT: 46.9 % — ABNORMAL HIGH (ref 36.0–46.0)
Hemoglobin: 15.5 g/dL — ABNORMAL HIGH (ref 12.0–15.0)
MCH: 27.6 pg (ref 26.0–34.0)
MCHC: 33 g/dL (ref 30.0–36.0)
MCV: 83.5 fL (ref 80.0–100.0)
Platelets: 221 10*3/uL (ref 150–400)
RBC: 5.62 MIL/uL — ABNORMAL HIGH (ref 3.87–5.11)
RDW: 13.8 % (ref 11.5–15.5)
WBC: 7.1 10*3/uL (ref 4.0–10.5)
nRBC: 0 % (ref 0.0–0.2)

## 2019-04-14 LAB — URINALYSIS, ROUTINE W REFLEX MICROSCOPIC
Bilirubin Urine: NEGATIVE
Glucose, UA: NEGATIVE mg/dL
Hgb urine dipstick: NEGATIVE
Ketones, ur: 5 mg/dL — AB
Leukocytes,Ua: NEGATIVE
Nitrite: NEGATIVE
Protein, ur: NEGATIVE mg/dL
Specific Gravity, Urine: 1.005 (ref 1.005–1.030)
pH: 7 (ref 5.0–8.0)

## 2019-04-14 LAB — BASIC METABOLIC PANEL
Anion gap: 10 (ref 5–15)
BUN: 12 mg/dL (ref 8–23)
CO2: 24 mmol/L (ref 22–32)
Calcium: 9.5 mg/dL (ref 8.9–10.3)
Chloride: 109 mmol/L (ref 98–111)
Creatinine, Ser: 1.02 mg/dL — ABNORMAL HIGH (ref 0.44–1.00)
GFR calc Af Amer: 60 mL/min — ABNORMAL LOW (ref >60)
GFR calc non Af Amer: 52 mL/min — ABNORMAL LOW (ref >60)
Glucose, Bld: 111 mg/dL — ABNORMAL HIGH (ref 70–99)
Potassium: 4.1 mmol/L (ref 3.5–5.1)
Sodium: 143 mmol/L (ref 135–145)

## 2019-04-14 LAB — TROPONIN I (HIGH SENSITIVITY)
Troponin I (High Sensitivity): 3 ng/L (ref <18)
Troponin I (High Sensitivity): 3 ng/L (ref <18)

## 2019-04-14 LAB — CBG MONITORING, ED: Glucose-Capillary: 103 mg/dL — ABNORMAL HIGH (ref 70–99)

## 2019-04-14 MED ORDER — SODIUM CHLORIDE 0.9 % IV BOLUS
500.0000 mL | Freq: Once | INTRAVENOUS | Status: AC
  Administered 2019-04-14: 500 mL via INTRAVENOUS

## 2019-04-14 NOTE — ED Provider Notes (Signed)
[facility] EMERGENCY DEPARTMENT Provider Note   CSN: [id number] Arrival date & time: 04/14/19  1001     History Chief Complaint  Patient presents with  . Dizziness  . Neck Pain    Diana Hurley is a 84 y.o. female with a past medical history significant for hypertension, hyperlipidemia, mitral regurgitation, sickle cell trait, paroxysmal SVT, and diabetes mellitus type 2 diet and exercise controlled who presents to the ED due to gradual onset of worsening lightheadedness x2 to 3 months.  Patient states she has been feeling lightheaded while standing over the past few months.  She notes her lightheadedness got worse today which caused her to report to the ED.  Patient denies associative chest pain, shortness of breath, headache, and blurred vision. Denies spinning sensation. Patient notes when she feels lightheaded she also experiences generalized weakness.  Denies speech difficulties and unilateral weakness.  Denies urinary symptoms. She also admits to left-sided neck and shoulder pain/tension that worsened today. Denies injury. Denies numbness/tingling. Denies fever and chills. No recent illness. She received her 2nd COVID vaccine 2 weeks ago. Denies syncopal episodes and head injury.  Denies melena, hematochezia, and hematemesis.  Patient not currently on any blood thinners.    Past Medical History:  Diagnosis Date  . Allergic rhinitis   . Atrophic vaginitis   . Bradycardia   . Breast thickening    left  . DDD (degenerative disc disease) 1998   Exacerbation in 2004 w/right radicular Sx  . Diabetes mellitus, type 2 ([place]) 2007  . Diastolic dysfunction    GRADE 1   . Diverticulosis 2005   Found during colonoscopy  . DJD (degenerative joint disease)   . DOE (dyspnea on exertion) 03/01/12   Sx have increased over past several months, referral to [facility]  . Elevated liver enzymes 2001   Secondary to a gallstone & fatty filtration of the liver  . Essential  hypertension, benign   . Fatty infiltration of liver 2001  . Gastritis   . Hypercholesterolemia 2003  . Hyperlipidemia   . Hypertension   . Lactose intolerance   . Mitral regurgitation   . Muscle cramps    In hands and thighs  . Musculoskeletal chest pain   . Osteopenia 2000  . Osteoporosis 06/2015   Right Hip  . Palpitations    recurrent  . PSVT (paroxysmal supraventricular tachycardia) ([place]) 2011  . PVC's (premature ventricular contractions) 2011  . Shortness of breath   . Sickle cell trait ([place])   . Venous insufficiency 2009  . Vertigo   . Vitamin D deficiency 2010    Patient Active Problem List   Diagnosis Date Noted  . ESSENTIAL HYPERTENSION, BENIGN 11/27/2009  . BRADYCARDIA 10/16/2009  . PALPITATIONS, RECURRENT 10/16/2009  . SHORTNESS OF BREATH 10/16/2009    Past Surgical History:  Procedure Laterality Date  . CHOLECYSTECTOMY  2006  . COLONOSCOPY    . ESOPHAGOGASTRODUODENOSCOPY  03/2017   gastritis  . TOTAL ABDOMINAL HYSTERECTOMY  1982   for fibroids     OB History    Gravida  2   Para  2   Term  2   Preterm  0   AB  0   Living  3     SAB  0   TAB  0   Ectopic  0   Multiple  1   Live Births  3           Family History  Problem Relation Age  of Onset  . Kidney failure Mother        renal failure secondary to diabetes  . Diabetes Mother   . Breast cancer Mother 62  . Liver cancer Father 60  . Hypertension Sister   . Hyperlipidemia Brother   . Hypertension Sister   . CAD Sister   . Hypertension Sister   . Diabetes Sister   . Stroke Sister   . Hypertension Sister   . Colon cancer Maternal Aunt   . Colon cancer Maternal Uncle   . Esophageal cancer Neg Hx   . Rectal cancer Neg Hx   . Stomach cancer Neg Hx     Social History   Tobacco Use  . Smoking status: Never Smoker  . Smokeless tobacco: Never Used  Substance Use Topics  . Alcohol use: No    Comment: Rare. Drinks about 1 glass a wine a month  . Drug use: No     Home Medications Prior to Admission medications   Medication Sig Start Date End Date Taking? Authorizing Provider  amLODipine (NORVASC) 5 MG tablet Take 5 mg by mouth daily.  07/06/14   [provider]  estradiol (ESTRACE VAGINAL) 0.1 MG/GM vaginal cream Place 1/2 gram vaginally at hs 2 to 3 times per week. 01/13/19   Nunzio Cobbs, MD  Multiple Vitamins-Minerals (MULTIPLE VITAMINS/WOMENS PO) Take by mouth daily.    [provider]  pantoprazole (PROTONIX) 40 MG tablet Take 1 tablet by mouth as needed. 11/15/18   [provider]  triamcinolone (KENALOG) 0.025 % ointment Apply 1 application topically 2 (two) times daily. 08/31/18   Nunzio Cobbs, MD    Allergies    Penicillins, Acarbose, Ace inhibitors, Fluvastatin, Food, Glipizide, Januvia [sitagliptin phosphate], Lovastatin, Metformin and related, Pravastatin, and Simvastatin  Review of Systems   Review of Systems  Constitutional: Negative for chills and fever.  Eyes: Negative for visual disturbance.  Respiratory: Negative for shortness of breath.   Cardiovascular: Negative for chest pain and leg swelling.  Gastrointestinal: Positive for abdominal pain (chronic abdominal pain no change). Negative for diarrhea, nausea and vomiting.  Musculoskeletal: Positive for arthralgias (left shoulder) and neck pain (left side neck).  Neurological: Positive for weakness (generalized weakness. ) and light-headedness. Negative for syncope, facial asymmetry, speech difficulty, numbness and headaches.  All other systems reviewed and are negative.   Physical Exam Updated Vital Signs BP (!) 172/71   Pulse 85   Temp 97.8 F (36.6 C) (Oral)   Resp 16   Ht 5\' 2"  (1.575 m)   Wt 59.9 kg   SpO2 100%   BMI 24.14 kg/m   Physical Exam Vitals and nursing note reviewed.  Constitutional:      General: She is not in acute distress.    Appearance: She is not ill-appearing.  HENT:     Head:  Normocephalic.     Mouth/Throat:     Comments: Dry mucous membranes. Eyes:     Pupils: Pupils are equal, round, and reactive to light.  Neck:     Comments: No cervical midline tenderness.  No meningismus.  Full range of motion of neck.  Mild tenderness palpation on left side of neck. Cardiovascular:     Rate and Rhythm: Normal rate and regular rhythm.     Pulses: Normal pulses.     Heart sounds: Normal heart sounds. No murmur. No friction rub. No gallop.   Pulmonary:     Effort: Pulmonary effort is normal.  Breath sounds: Normal breath sounds.  Abdominal:     General: Abdomen is flat. Bowel sounds are normal. There is no distension.     Palpations: Abdomen is soft.     Tenderness: There is no abdominal tenderness. There is no guarding or rebound.  Musculoskeletal:     Cervical back: Neck supple.     Comments: Able to move all 4 extremities without difficulty.  No lower extremity edema.  Full range of motion of left shoulder.  No overlying erythema, edema, or warmth.  Skin:    General: Skin is warm and dry.  Neurological:     General: No focal deficit present.     Mental Status: She is alert.     Comments: Speech is clear, able to follow commands CN III-XII intact Normal strength in upper and lower extremities bilaterally including dorsiflexion and plantar flexion, strong and equal grip strength Sensation grossly intact throughout Moves extremities without ataxia, coordination intact No pronator drift   Psychiatric:        Mood and Affect: Mood normal.        Behavior: Behavior normal.     ED Results / Procedures / Treatments   Labs (all labs ordered are listed, but only abnormal results are displayed) Labs Reviewed  BASIC METABOLIC PANEL - Abnormal; Notable for the following components:      Result Value   Glucose, Bld 111 (*)    Creatinine, Ser 1.02 (*)    GFR calc non Af Amer 52 (*)    GFR calc Af Amer 60 (*)    All other components within normal limits  CBC -  Abnormal; Notable for the following components:   RBC 5.62 (*)    Hemoglobin 15.5 (*)    HCT 46.9 (*)    All other components within normal limits  URINALYSIS, ROUTINE W REFLEX MICROSCOPIC - Abnormal; Notable for the following components:   Color, Urine COLORLESS (*)    Ketones, ur 5 (*)    All other components within normal limits  CBG MONITORING, ED - Abnormal; Notable for the following components:   Glucose-Capillary 103 (*)    All other components within normal limits  TROPONIN I (HIGH SENSITIVITY)  TROPONIN I (HIGH SENSITIVITY)    EKG EKG Interpretation  Date/Time:  Wednesday April 14 2019 10:05:24 EST Ventricular Rate:  80 PR Interval:  142 QRS Duration: 78 QT Interval:  400 QTC Calculation: 461 R Axis:   53 Text Interpretation: Normal sinus rhythm Septal infarct , age undetermined Abnormal ECG Confirmed by Lennice Sites [phone]) on 04/14/2019 11:27:53 AM   Radiology No results found.  Procedures Procedures (including critical care time)  Medications Ordered in ED Medications  sodium chloride 0.9 % bolus 500 mL (0 mLs Intravenous Stopped 04/14/19 1312)    ED Course  I have reviewed the triage vital signs and the nursing notes.  Pertinent labs & imaging results that were available during my care of the patient were reviewed by me and considered in my medical decision making (see chart for details).  Clinical Course as of Apr 13 1500  Wed Apr 14, 2019  1147 Troponin I (High Sensitivity): 3 [CA]  1440 Troponin I (High Sensitivity): 3 [CA]  1452 Reassessed patient at bedside who notes improved in symptoms after IVFs.    [CA]    Clinical Course User Index [CA] Suzy Bouchard, PA-C   MDM Rules/Calculators/A&P  84 year old female presents to the ED due to worsening lightheadedness x2 to 3 months no shaded with generalized weakness.  Denies chest pain, shortness of breath, COVID symptoms, hematemesis, melena, and hematochezia.  Denies  syncopal episodes.  Stable vitals with mildly elevated BP at 172/71.  High blood pressure likely due to not taking blood pressure medication this morning.  Patient denies headache, nausea, visual changes.  Doubt hypertensive urgency/emergency.  Patient in no acute distress and non-ill-appearing.  Physical exam reassuring.  Dry mucous membranes. Will obtain routine labs and give IVFs.   Orthostatic vitals normal.  CBC reassuring with no leukocytosis. EKG personally reviewed which demonstrates normal sinus rhythm with no signs of ischemia. BMP reassuring with mild elevated creatinine at 1.02.  UA negative for signs of infection.  Initial troponin normal at 3.  Will obtain delta troponin.  Delta troponin flat.  Doubt ACS. All results reassuring. Discussed case with Dr. Ronnald Nian who evaluated patient at bedside and agrees with assessment and plan. Upon reassessment, patient notes she feels better after IVFs. Suspect lightheadedness could be related to mild dehydration due to increased creatinine and dry mucus membranes. Advised patient to continue to drink a lot of fluids. Instructed patient to follow-up with PCP within the next week for further evaluation. Strict ED precautions discussed with patient. Patient states understanding and agrees to plan. Patient discharged home in no acute distress and stable vitals  Final Clinical Impression(s) / ED Diagnoses Final diagnoses:  Lightheadedness  Generalized weakness    Rx / DC Orders ED Discharge Orders    None       Suzy Bouchard, PA-C 04/14/19 Cross, Adam, DO 04/14/19 1507

## 2019-04-14 NOTE — ED Triage Notes (Signed)
Pt reports intermittent dizziness for the past week along with left sided neck pain. Pt denies headache or blurred vision.  Pt arrives alert, oriented and ambulatory.

## 2019-04-14 NOTE — Discharge Instructions (Addendum)
As discussed, all of your labs look good today. Your cardiac marker was normal, so you are not having a heart attack. I assume your lightheadedness could be related to dehydration. Continue drinking a lot of fluids. Follow-up with your PCP within the next week for further evaluation. Return to the ER for new or worsening symptoms.

## 2019-04-21 ENCOUNTER — Telehealth: Payer: Self-pay | Admitting: *Deleted

## 2019-04-21 ENCOUNTER — Other Ambulatory Visit: Payer: Self-pay | Admitting: *Deleted

## 2019-04-21 DIAGNOSIS — R42 Dizziness and giddiness: Secondary | ICD-10-CM

## 2019-04-21 DIAGNOSIS — R55 Syncope and collapse: Secondary | ICD-10-CM

## 2019-04-21 NOTE — Telephone Encounter (Signed)
Patient enrolled for [employer] to ship a 30 day cardiac event monitor to her home.  Instructions reviewed briefly as they are included in the monitor kit. 

## 2019-04-28 ENCOUNTER — Ambulatory Visit (INDEPENDENT_AMBULATORY_CARE_PROVIDER_SITE_OTHER): Payer: Medicare HMO

## 2019-04-28 ENCOUNTER — Other Ambulatory Visit: Payer: Self-pay | Admitting: Family Medicine

## 2019-04-28 DIAGNOSIS — R42 Dizziness and giddiness: Secondary | ICD-10-CM | POA: Diagnosis not present

## 2019-04-28 DIAGNOSIS — R55 Syncope and collapse: Secondary | ICD-10-CM | POA: Diagnosis not present

## 2019-05-12 ENCOUNTER — Ambulatory Visit: Payer: Medicare HMO | Admitting: Cardiovascular Disease

## 2019-05-24 ENCOUNTER — Ambulatory Visit
Admission: RE | Admit: 2019-05-24 | Discharge: 2019-05-24 | Disposition: A | Discharge status: Home / Self Care | Payer: Medicare HMO | Source: Ambulatory Visit | Attending: Family Medicine | Admitting: Family Medicine

## 2019-05-24 ENCOUNTER — Other Ambulatory Visit: Payer: Self-pay

## 2019-05-24 DIAGNOSIS — R42 Dizziness and giddiness: Secondary | ICD-10-CM

## 2019-05-24 MED ORDER — GADOBENATE DIMEGLUMINE 529 MG/ML IV SOLN
12.0000 mL | Freq: Once | INTRAVENOUS | Status: AC | PRN
  Administered 2019-05-24: 12 mL via INTRAVENOUS

## 2019-09-27 NOTE — Progress Notes (Signed)
84 y.o. G27P2003 Married [place] American female here for annual exam.    Using vaginal estrogen cream once every 2 to 4 weeks.  Not sexually active very often.  She wants to continue with this due to periodic vaginal irritation from atrophy.  She is using triamcinolone ointment once every week or two for external vulvar irritation.  Her husband is using triamcinolone cream and she used his.  A1C is 6.   Has been vaccinated against Covid.   PCP: Derinda Late, MD    No LMP recorded. Patient has had a hysterectomy.           Sexually active: Yes.    The current method of family planning is status post hysterectomy--ovaries remain.    Exercising: Yes.    walks 6 days/week Smoker:  no  Health Maintenance: Pap: 07-21-14 Neg History of abnormal Pap:  no MMG: 11-19-18 3D/Neg/density B/BiRads2 Colonoscopy:  08-19-13 normal, no repeat needed. BMD: 06/2017  Result :Osteopenia. Improved from 2017--PCP --- Pt. Had BMD 09-24-19 results pending TDaP:  06/2014 Gardasil:   no HIV:  Years ago Hep C: Years ago Screening Labs:  PCP.   reports that she has never smoked. She has never used smokeless tobacco. She reports that she does not drink alcohol and does not use drugs.  Past Medical History:  Diagnosis Date  . Allergic rhinitis   . Atrophic vaginitis   . Bradycardia   . Breast thickening    left  . DDD (degenerative disc disease) 1998   Exacerbation in 2004 w/right radicular Sx  . Diabetes mellitus, type 2 ([place]) 2007  . Diastolic dysfunction    GRADE 1   . Diverticulosis 2005   Found during colonoscopy  . DJD (degenerative joint disease)   . DOE (dyspnea on exertion) 03/01/12   Sx have increased over past several months, referral to [facility]  . Elevated liver enzymes 2001   Secondary to a gallstone & fatty filtration of the liver  . Essential hypertension, benign   . Fatty infiltration of liver 2001  . Gastritis   . Hypercholesterolemia 2003  . Hyperlipidemia   . Hypertension    . Lactose intolerance   . Mitral regurgitation   . Muscle cramps    In hands and thighs  . Musculoskeletal chest pain   . Osteopenia 2000  . Osteoporosis 06/2015   Right Hip  . Palpitations    recurrent  . PSVT (paroxysmal supraventricular tachycardia) ([place]) 2011  . PVC's (premature ventricular contractions) 2011  . Shortness of breath   . Sickle cell trait ([place])   . Venous insufficiency 2009  . Vertigo   . Vitamin D deficiency 2010    Past Surgical History:  Procedure Laterality Date  . CHOLECYSTECTOMY  2006  . COLONOSCOPY    . ESOPHAGOGASTRODUODENOSCOPY  03/2017   gastritis  . TOTAL ABDOMINAL HYSTERECTOMY  1982   for fibroids    Current Outpatient Medications  Medication Sig Dispense Refill  . amLODipine (NORVASC) 5 MG tablet Take 5 mg by mouth daily.   0  . Calcium Carbonate-Vitamin D (CALCIUM 500 + D PO) Take 1 tablet by mouth daily.    Marland Kitchen estradiol (ESTRACE VAGINAL) 0.1 MG/GM vaginal cream Place 1/2 gram vaginally at hs 2 to 3 times per week. 42.5 g 0  . Multiple Vitamins-Minerals (MULTIPLE VITAMINS/WOMENS PO) Take by mouth daily.    . rosuvastatin (CRESTOR) 5 MG tablet Take by mouth. Takes twice a week    . triamcinolone (KENALOG) 0.025 %  ointment Apply 1 application topically 2 (two) times daily. 30 g 1   No current facility-administered medications for this visit.    Family History  Problem Relation Age of Onset  . Kidney failure Mother        renal failure secondary to diabetes  . Diabetes Mother   . Breast cancer Mother 67  . Liver cancer Father 62  . Hypertension Sister   . Hyperlipidemia Brother   . Hypertension Sister   . CAD Sister   . Hypertension Sister   . Diabetes Sister   . Stroke Sister   . Hypertension Sister   . Colon cancer Maternal Aunt   . Colon cancer Maternal Uncle   . Esophageal cancer Neg Hx   . Rectal cancer Neg Hx   . Stomach cancer Neg Hx     Review of Systems  All other systems reviewed and are negative.   Exam:    BP 140/76   Pulse 70   Resp 20   Ht 5' 1.5" (1.562 m)   Wt 137 lb 6.4 oz (62.3 kg)   BMI 25.54 kg/m     General appearance: alert, cooperative and appears stated age Head: normocephalic, without obvious abnormality, atraumatic Neck: no adenopathy, supple, symmetrical, trachea midline and thyroid normal to inspection and palpation Lungs: clear to auscultation bilaterally Breasts: normal appearance, no masses or tenderness, No nipple retraction or dimpling, No nipple discharge or bleeding, No axillary adenopathy Heart: regular rate and rhythm Abdomen: soft, non-tender; no masses, no organomegaly Extremities: extremities normal, atraumatic, no cyanosis or edema Skin: skin color, texture, turgor normal. No rashes or lesions Lymph nodes: cervical, supraclavicular, and axillary nodes normal. Neurologic: grossly normal  Pelvic: External genitalia:  no lesions              No abnormal inguinal nodes palpated.              Urethra:  normal appearing urethra with no masses, tenderness or lesions              Bartholins and Skenes: normal                 Vagina: normal appearing vagina with normal color and discharge, no lesions.  Discomfort with exams.              Cervix: absent              Pap taken: No. Bimanual Exam:  Uterus:  Absent.               Adnexa: no mass, fullness, tenderness              Rectal exam: Yes.  .  Confirms.              Anus:  normal sphincter tone, no lesions  Chaperone was present for exam.  Assessment:   Well woman visit with normal exam. Status post TAH for fibroids. Ovaries remain.  Osteoporosis. Off Fosamax. PCP following.  Intermittent vulvitis.  Vaginal atrophy.  DM. FH breast cancer in mother.   Plan: Mammogram screening discussed. Self breast awareness reviewed. Pap and HR HPV as above. Guidelines for Calcium, Vitamin D, regular exercise program including cardiovascular and weight bearing exercise. Refill of Trimacinolone and Vaginal  estrogen cream. I discussed potential effect on breast cancer.  Follow up annually and prn.   After visit summary provided.

## 2019-09-29 ENCOUNTER — Ambulatory Visit (INDEPENDENT_AMBULATORY_CARE_PROVIDER_SITE_OTHER): Payer: Medicare HMO | Admitting: Obstetrics and Gynecology

## 2019-09-29 ENCOUNTER — Other Ambulatory Visit: Payer: Self-pay

## 2019-09-29 ENCOUNTER — Encounter: Payer: Self-pay | Admitting: Obstetrics and Gynecology

## 2019-09-29 DIAGNOSIS — Z01419 Encounter for gynecological examination (general) (routine) without abnormal findings: Secondary | ICD-10-CM | POA: Diagnosis not present

## 2019-09-29 MED ORDER — ESTRADIOL 0.1 MG/GM VA CREA: TOPICAL_CREAM | VAGINAL | 1 refills | Status: DC

## 2019-09-29 MED ORDER — TRIAMCINOLONE ACETONIDE 0.025 % EX OINT: 1.0000 "application " | TOPICAL_OINTMENT | Freq: Two times a day (BID) | CUTANEOUS | 1 refills | Status: DC

## 2019-09-29 NOTE — Patient Instructions (Signed)

## 2020-08-31 ENCOUNTER — Other Ambulatory Visit: Payer: Self-pay | Admitting: Family Medicine

## 2020-09-01 LAB — SARS CORONAVIRUS 2 (TAT 6-24 HRS): SARS Coronavirus 2: NEGATIVE

## 2020-10-31 NOTE — Progress Notes (Signed)
85 y.o. G15P2003 Married [place] American female here for breast and pelvic exam.   She is followed for chronic vulvitis and vaginal atrophy. Does well if she uses the Triamcinolone and vaginal estrogen cream.   A1C is 5.9.   PCP:   Dr. Sandi Mariscal  No LMP recorded. Patient has had a hysterectomy.           Sexually active: yes.  The current method of family planning is status post hysterectomy.    Exercising: yes.  Walking daily 3 miles except for Sundays. Works in the yard also.  Smoker:  no.  Health Maintenance: Pap:  07/21/14, Neg History of abnormal Pap:  no MMG:  11/25/19 at [facility], neg.  Has appointment on Oct. 13.  Colonoscopy:  04/23/18, no repeat due to age BMD:   06/2017 [facility]  Result  Osteopenia. TDaP:  06/2014 Gardasil:   no HIV: years ago Hep C: years ago Screening Labs:  PCP.   reports that she has never smoked. She has never used smokeless tobacco. She reports that she does not currently use alcohol. She reports that she does not use drugs.  Past Medical History:  Diagnosis Date   Allergic rhinitis    Atrophic vaginitis    Bradycardia    Breast thickening    left   DDD (degenerative disc disease) 1998   Exacerbation in 2004 w/right radicular Sx   Diabetes mellitus, type 2 ([place]) [id number]   Diastolic dysfunction    GRADE 1    Diverticulosis 2005   Found during colonoscopy   DJD (degenerative joint disease)    DOE (dyspnea on exertion) 03/01/12   Sx have increased over past several months, referral to [facility]   Elevated liver enzymes 2001   Secondary to a gallstone & fatty filtration of the liver   Essential hypertension, benign    Fatty infiltration of liver 2001   Gastritis    Hypercholesterolemia 2003   Hyperlipidemia    Hypertension    Lactose intolerance    Mitral regurgitation    Muscle cramps    In hands and thighs   Musculoskeletal chest pain    Osteopenia 2000   Osteoporosis 06/2015   Right Hip   Palpitations    recurrent   PSVT (paroxysmal  supraventricular tachycardia) ([place]) 2011   PVC's (premature ventricular contractions) 2011   Shortness of breath    Sickle cell trait ([place])    Venous insufficiency 2009   Vertigo    Vitamin D deficiency 2010    Past Surgical History:  Procedure Laterality Date   CHOLECYSTECTOMY  2006   COLONOSCOPY     ESOPHAGOGASTRODUODENOSCOPY  03/2017   gastritis   TOTAL ABDOMINAL HYSTERECTOMY  1982   for fibroids    Current Outpatient Medications  Medication Sig Dispense Refill   amLODipine (NORVASC) 5 MG tablet Take 5 mg by mouth daily.   0   Calcium Carbonate-Vitamin D (CALCIUM 500 + D PO) Take 1 tablet by mouth daily.     estradiol (ESTRACE VAGINAL) 0.1 MG/GM vaginal cream Place 1/2 gram vaginally at hs 1 to 2  times per week. 42.5 g 1   Multiple Vitamins-Minerals (MULTIPLE VITAMINS/WOMENS PO) Take by mouth daily.     rosuvastatin (CRESTOR) 5 MG tablet Take by mouth. Takes twice a week     triamcinolone (KENALOG) 0.025 % ointment Apply 1 application topically 2 (two) times daily. 30 g 1   No current facility-administered medications for this visit.    Family History  Problem Relation Age of Onset   Kidney failure Mother        renal failure secondary to diabetes   Diabetes Mother    Breast cancer Mother 55   Liver cancer Father 69   Hypertension Sister    Hyperlipidemia Brother    Hypertension Sister    CAD Sister    Hypertension Sister    Diabetes Sister    Stroke Sister    Hypertension Sister    Colon cancer Maternal Aunt    Colon cancer Maternal Uncle    Esophageal cancer Neg Hx    Rectal cancer Neg Hx    Stomach cancer Neg Hx     Review of Systems  All other systems reviewed and are negative.  Exam:   BP 130/74 (Cuff Size: Normal)   Pulse 64   Ht '5\' 2"'$  (1.575 m)   Wt 134 lb (60.8 kg)   SpO2 98%   BMI 24.51 kg/m     General appearance: alert, cooperative and appears stated age Head: normocephalic, without obvious abnormality, atraumatic Neck: no adenopathy,  supple, symmetrical, trachea midline and thyroid normal to inspection and palpation Lungs: clear to auscultation bilaterally Breasts: normal appearance, no masses or tenderness, No nipple retraction or dimpling, No nipple discharge or bleeding, No axillary adenopathy Heart: regular rate and rhythm Abdomen: soft, non-tender; no masses, no organomegaly Extremities: extremities normal, atraumatic, no cyanosis or edema Skin: skin color, texture, turgor normal. No rashes or lesions Lymph nodes: cervical, supraclavicular, and axillary nodes normal. Neurologic: grossly normal  Pelvic: External genitalia:  no lesions              No abnormal inguinal nodes palpated.              Urethra:  normal appearing urethra with no masses, tenderness or lesions              Bartholins and Skenes: normal                 Vagina: normal appearing vagina with normal color and discharge, no lesions              Cervix: absent              Pap taken: no Bimanual Exam:  Uterus:  absent              Adnexa: no mass, fullness, tenderness              Rectal exam: yes.  Confirms.              Anus:  normal sphincter tone, no lesions  Chaperone was present for exam:  Onalee Hua., CMA  Assessment:   Well woman visit with gynecologic exam. Status post TAH for fibroids.  Ovaries remain.  Osteoporosis.  Off Fosamax.  PCP following.  Intermittent chronic vulvitis.   Vaginal atrophy.  DM. FH breast cancer in mother.   Plan: Mammogram screening discussed. Self breast awareness reviewed. Pap and HR HPV as above. Guidelines for Calcium, Vitamin D, regular exercise program including cardiovascular and weight bearing exercise. Refill of estrace cream.  I discussed potential effect on breast cancer.  Refill of triamcinolone.   She will [facility] to get [facility].  PCP following.  Follow up annually and prn.    After visit summary provided.   21 min  total time was spent for this patient encounter,  including preparation, face-to-face counseling with the patient, coordination of care,  and documentation of the encounter.

## 2020-11-01 ENCOUNTER — Encounter: Payer: Self-pay | Admitting: Obstetrics and Gynecology

## 2020-11-01 ENCOUNTER — Other Ambulatory Visit: Payer: Self-pay

## 2020-11-01 ENCOUNTER — Ambulatory Visit (INDEPENDENT_AMBULATORY_CARE_PROVIDER_SITE_OTHER): Payer: Medicare HMO | Admitting: Obstetrics and Gynecology

## 2020-11-01 VITALS — BP 130/74 | HR 64 | Ht 62.0 in | Wt 134.0 lb

## 2020-11-01 DIAGNOSIS — Z01419 Encounter for gynecological examination (general) (routine) without abnormal findings: Secondary | ICD-10-CM

## 2020-11-01 DIAGNOSIS — N952 Postmenopausal atrophic vaginitis: Secondary | ICD-10-CM | POA: Diagnosis not present

## 2020-11-01 DIAGNOSIS — N763 Subacute and chronic vulvitis: Secondary | ICD-10-CM | POA: Diagnosis not present

## 2020-11-01 MED ORDER — ESTRADIOL 0.1 MG/GM VA CREA: TOPICAL_CREAM | VAGINAL | 1 refills | Status: DC

## 2020-11-01 MED ORDER — TRIAMCINOLONE ACETONIDE 0.025 % EX OINT: 1.0000 "application " | TOPICAL_OINTMENT | Freq: Two times a day (BID) | CUTANEOUS | 1 refills | Status: DC

## 2020-11-01 NOTE — Patient Instructions (Signed)

## 2021-02-20 ENCOUNTER — Other Ambulatory Visit: Payer: Self-pay | Admitting: Family Medicine

## 2021-02-21 LAB — SARS CORONAVIRUS 2 (TAT 6-24 HRS): SARS Coronavirus 2: NEGATIVE

## 2021-03-07 ENCOUNTER — Emergency Department (HOSPITAL_COMMUNITY): Payer: Medicare HMO

## 2021-03-07 ENCOUNTER — Other Ambulatory Visit: Payer: Self-pay

## 2021-03-07 ENCOUNTER — Emergency Department (HOSPITAL_COMMUNITY)
Admission: EM | Admit: 2021-03-07 | Discharge: 2021-03-07 | Disposition: A | Discharge status: Home / Self Care | Payer: Medicare HMO | Attending: Emergency Medicine | Admitting: Emergency Medicine

## 2021-03-07 DIAGNOSIS — R7309 Other abnormal glucose: Secondary | ICD-10-CM | POA: Insufficient documentation

## 2021-03-07 DIAGNOSIS — Z20822 Contact with and (suspected) exposure to covid-19: Secondary | ICD-10-CM | POA: Insufficient documentation

## 2021-03-07 DIAGNOSIS — R531 Weakness: Secondary | ICD-10-CM | POA: Diagnosis present

## 2021-03-07 LAB — COMPREHENSIVE METABOLIC PANEL
ALT: 31 U/L (ref 0–44)
AST: 35 U/L (ref 15–41)
Albumin: 3.9 g/dL (ref 3.5–5.0)
Alkaline Phosphatase: 79 U/L (ref 38–126)
Anion gap: 15 (ref 5–15)
BUN: 15 mg/dL (ref 8–23)
CO2: 23 mmol/L (ref 22–32)
Calcium: 9.8 mg/dL (ref 8.9–10.3)
Chloride: 103 mmol/L (ref 98–111)
Creatinine, Ser: 1.09 mg/dL — ABNORMAL HIGH (ref 0.44–1.00)
GFR, Estimated: 50 mL/min — ABNORMAL LOW (ref >60)
Glucose, Bld: 145 mg/dL — ABNORMAL HIGH (ref 70–99)
Potassium: 3.7 mmol/L (ref 3.5–5.1)
Sodium: 141 mmol/L (ref 135–145)
Total Bilirubin: 0.5 mg/dL (ref 0.3–1.2)
Total Protein: 7.1 g/dL (ref 6.5–8.1)

## 2021-03-07 LAB — CBC WITH DIFFERENTIAL/PLATELET
Abs Immature Granulocytes: 0.03 10*3/uL (ref 0.00–0.07)
Basophils Absolute: 0 10*3/uL (ref 0.0–0.1)
Basophils Relative: 0 %
Eosinophils Absolute: 0.1 10*3/uL (ref 0.0–0.5)
Eosinophils Relative: 2 %
HCT: 43.8 % (ref 36.0–46.0)
Hemoglobin: 14.4 g/dL (ref 12.0–15.0)
Immature Granulocytes: 0 %
Lymphocytes Relative: 26 %
Lymphs Abs: 1.8 10*3/uL (ref 0.7–4.0)
MCH: 27.6 pg (ref 26.0–34.0)
MCHC: 32.9 g/dL (ref 30.0–36.0)
MCV: 83.9 fL (ref 80.0–100.0)
Monocytes Absolute: 0.8 10*3/uL (ref 0.1–1.0)
Monocytes Relative: 11 %
Neutro Abs: 4.1 10*3/uL (ref 1.7–7.7)
Neutrophils Relative %: 61 %
Platelets: 298 10*3/uL (ref 150–400)
RBC: 5.22 MIL/uL — ABNORMAL HIGH (ref 3.87–5.11)
RDW: 13.5 % (ref 11.5–15.5)
WBC: 6.8 10*3/uL (ref 4.0–10.5)
nRBC: 0 % (ref 0.0–0.2)

## 2021-03-07 LAB — RESP PANEL BY RT-PCR (FLU A&B, COVID) ARPGX2
Influenza A by PCR: NEGATIVE
Influenza B by PCR: NEGATIVE
SARS Coronavirus 2 by RT PCR: NEGATIVE

## 2021-03-07 LAB — TROPONIN I (HIGH SENSITIVITY)
Troponin I (High Sensitivity): 5 ng/L (ref <18)
Troponin I (High Sensitivity): 5 ng/L (ref <18)

## 2021-03-07 LAB — CBG MONITORING, ED: Glucose-Capillary: 87 mg/dL (ref 70–99)

## 2021-03-07 MED ORDER — SODIUM CHLORIDE 0.9 % IV BOLUS
500.0000 mL | Freq: Once | INTRAVENOUS | Status: AC
Start: 2021-03-07 — End: 2021-03-07
  Administered 2021-03-07: 500 mL via INTRAVENOUS

## 2021-03-07 NOTE — ED Provider Notes (Signed)
[facility] EMERGENCY DEPARTMENT Provider Note   CSN: [id number] Arrival date & time: 03/07/21  1108     History  Chief Complaint  Patient presents with   Weakness    Diana Hurley is a 86 y.o. female.  The history is provided by the patient.  Weakness Severity:  Mild Onset quality:  Gradual Duration:  4 weeks Timing:  Constant Progression:  Waxing and waning Chronicity:  New Context comment:  Decreased energy the last few weeks Relieved by:  Nothing Worsened by:  Nothing Associated symptoms: no abdominal pain, no anorexia, no aphasia, no arthralgias, no ataxia, no chest pain, no cough, no diarrhea, no difficulty walking, no dizziness, no drooling, no dysphagia, no dysuria, no numbness in extremities, no falls, no fever, no foul-smelling urine, no frequency, no headaches, no hematochezia, no lethargy, no loss of consciousness, no melena, no myalgias, no nausea, no near-syncope, no seizures, no sensory-motor deficit, no shortness of breath, no stroke symptoms, no syncope, no urgency, no vision change and no vomiting   Risk factors: no heart disease and no neurologic disease   Risk factors comment:  CKD     Home Medications Prior to Admission medications   Medication Sig Start Date End Date Taking? Authorizing Provider  amLODipine (NORVASC) 5 MG tablet Take 5 mg by mouth daily.  07/06/14   [provider]  Calcium Carbonate-Vitamin D (CALCIUM 500 + D PO) Take 1 tablet by mouth daily.    [provider]  estradiol (ESTRACE VAGINAL) 0.1 MG/GM vaginal cream Place 1/2 gram vaginally at hs 1 to 2  times per week. 11/01/20   Nunzio Cobbs, MD  Multiple Vitamins-Minerals (MULTIPLE VITAMINS/WOMENS PO) Take by mouth daily.    [provider]  rosuvastatin (CRESTOR) 5 MG tablet Take by mouth. Takes twice a week 09/13/19   [provider]  triamcinolone (KENALOG) 0.025 % ointment Apply 1 application topically 2 (two)  times daily. 11/01/20   Nunzio Cobbs, MD      Allergies    Penicillins, Acarbose, Ace inhibitors, Fluvastatin, Food, Glipizide, Januvia [sitagliptin phosphate], Lovastatin, Metformin and related, Pravastatin, and Simvastatin    Review of Systems   Review of Systems  Constitutional:  Negative for fever.  HENT:  Negative for drooling.   Respiratory:  Negative for cough and shortness of breath.   Cardiovascular:  Negative for chest pain, syncope and near-syncope.  Gastrointestinal:  Negative for abdominal pain, anorexia, diarrhea, dysphagia, hematochezia, melena, nausea and vomiting.  Genitourinary:  Negative for dysuria, frequency and urgency.  Musculoskeletal:  Negative for arthralgias, falls and myalgias.  Neurological:  Positive for weakness. Negative for dizziness, seizures, loss of consciousness and headaches.   Physical Exam Updated Vital Signs BP (!) 148/50    Pulse 68    Temp 98.2 F (36.8 C) (Oral)    Resp (!) 23    SpO2 98%  Physical Exam Vitals and nursing note reviewed.  Constitutional:      General: She is not in acute distress.    Appearance: She is well-developed. She is not ill-appearing.  HENT:     Head: Normocephalic and atraumatic.     Nose: Nose normal.     Mouth/Throat:     Mouth: Mucous membranes are dry.  Eyes:     Extraocular Movements: Extraocular movements intact.     Conjunctiva/sclera: Conjunctivae normal.     Pupils: Pupils are equal, round, and reactive to light.  Cardiovascular:  Rate and Rhythm: Normal rate and regular rhythm.     Pulses: Normal pulses.     Heart sounds: Normal heart sounds. No murmur heard. Pulmonary:     Effort: Pulmonary effort is normal. No respiratory distress.     Breath sounds: Normal breath sounds.  Abdominal:     Palpations: Abdomen is soft.     Tenderness: There is no abdominal tenderness.  Musculoskeletal:        General: No swelling.     Cervical back: Normal range of motion and neck supple.   Skin:    General: Skin is warm and dry.     Capillary Refill: Capillary refill takes less than 2 seconds.  Neurological:     General: No focal deficit present.     Mental Status: She is alert and oriented to person, place, and time.     Cranial Nerves: No cranial nerve deficit.     Sensory: No sensory deficit.     Motor: No weakness.     Coordination: Coordination normal.     Comments: 5+ out of 5 strength throughout, normal sensation, no drift, normal finger-to-nose finger, normal speech normal visual field  Psychiatric:        Mood and Affect: Mood normal.    ED Results / Procedures / Treatments   Labs (all labs ordered are listed, but only abnormal results are displayed) Labs Reviewed  CBC WITH DIFFERENTIAL/PLATELET - Abnormal; Notable for the following components:      Result Value   RBC 5.22 (*)    All other components within normal limits  COMPREHENSIVE METABOLIC PANEL - Abnormal; Notable for the following components:   Glucose, Bld 145 (*)    Creatinine, Ser 1.09 (*)    GFR, Estimated 50 (*)    All other components within normal limits  RESP PANEL BY RT-PCR (FLU A&B, COVID) [id number]  CBG MONITORING, ED  TROPONIN I (HIGH SENSITIVITY)  TROPONIN I (HIGH SENSITIVITY)    EKG EKG Interpretation  Date/Time:  Wednesday March 07 2021 11:19:52 EST Ventricular Rate:  73 PR Interval:  150 QRS Duration: 86 QT Interval:  402 QTC Calculation: 442 R Axis:   50 Text Interpretation: Normal sinus rhythm No significant change since last tracing When compared with ECG of 14-Apr-2019 10:05, PREVIOUS ECG IS PRESENT Confirmed by Lennice Sites ([id number]) on 03/07/2021 1:39:03 PM  Radiology DG Chest 2 View  Result Date: 03/07/2021 CLINICAL DATA:  Weakness EXAM: CHEST - 2 VIEW COMPARISON:  03/02/2013 FINDINGS: Cardiac and mediastinal contours are within normal limits. No focal pulmonary opacity. No pleural effusion or pneumothorax. No acute osseous abnormality. IMPRESSION: No acute  cardiopulmonary process. Electronically Signed   By: Merilyn Baba M.D.   On: 03/07/2021 11:52    Procedures Procedures    Medications Ordered in ED Medications  sodium chloride 0.9 % bolus 500 mL (500 mLs Intravenous New Bag/Given 03/07/21 1411)    ED Course/ Medical Decision Making/ A&P                           Medical Decision Making Amount and/or Complexity of Data Reviewed Labs: ordered.   Kyeisha Janowicz Schramm is here with generalized weakness for the last several weeks.  Unremarkable vitals.  No fever.  No chest pain, no shortness of breath.  Had a COVID test that was negative few weeks ago.  Denies pain with urination.  Overall nonspecific symptoms.  Just some generalized malaise and not the same  energy level is normal.  No stroke symptoms.  Neurologically she is intact and exam is overall benign and normal.  We will cast a broad work-up to evaluate diagnosis of dehydration or acute coronary syndrome, electrolyte abnormality,  pneumonia, viral process.  Will give fluid bolus and reevaluate.  EKG done and per my interpretation shows sinus rhythm.  No ischemic changes.  4 pm labs have resulted and upon my review and interpretation patient has no significant findings.  Troponin negative x2.  No chest pain.  Doubt acute coronary syndrome.  No significant anemia, electrolyte abnormality, kidney injury.  My review and interpretation of the patient's chest x-ray shows no evidence of pneumonia or pneumothorax.  Overall, this appears to be a fairly chronic process.  I do not see any acute issues needing admission.  Think that she can follow-up with primary care doctor to check vitamin levels and other work-up.  She is not having any urinary symptoms.  No fever.  Overall stable for discharge.  This chart was dictated using voice recognition software.  Despite best efforts to proofread,  errors can occur which can change the documentation meaning.         Final Clinical Impression(s) / ED  Diagnoses Final diagnoses:  Weakness    Rx / DC Orders ED Discharge Orders     None         Lennice Sites, DO 03/07/21 1555

## 2021-03-07 NOTE — Discharge Instructions (Signed)
Follow-up with your primary care doctor.  Please return if symptoms worsen.

## 2021-03-07 NOTE — ED Triage Notes (Signed)
Pt reports increase weakness x 1 month. Per pt neg. Covid x 2 weeks ago.   HX: HTN, DM

## 2021-03-07 NOTE — ED Provider Triage Note (Signed)
Emergency Medicine Provider Triage Evaluation Note  YUDIT MODESITT , a 86 y.o. female  was evaluated in triage.  Pt complains of generalized weakness not feeling well.  She states this is been going on for about a month.  She also reports she has had elevated blood pressure.  She saw her PCP about her blood pressure, they had her double her blood pressure medication for 7 days, she is on day 4 7.  She has had no other medication changes recently.  She reports a fever 2 or 3 weeks ago.  No cough, nausea, vomiting, abd pain, urinary symptoms  Review of Systems  Positive: Weakness Negative: Cough  Physical Exam  BP (!) 163/84    Pulse 84    Temp 98.2 F (36.8 C) (Oral)    SpO2 100%  Gen:   Awake, no distress   Resp:  Normal effort  MSK:   Moves extremities without difficulty  Other:  No ttp of abd  Medical Decision Making  Medically screening exam initiated at 11:27 AM.  Appropriate orders placed.  Chena Chohan Saintil was informed that the remainder of the evaluation will be completed by another provider, this initial triage assessment does not replace that evaluation, and the importance of remaining in the ED until their evaluation is complete.  labs, cxr, covid, ekg, ua   Lamaya Hyneman, PA-C 03/07/21 1128

## 2021-06-21 ENCOUNTER — Other Ambulatory Visit: Payer: Self-pay

## 2021-06-21 DIAGNOSIS — Z01419 Encounter for gynecological examination (general) (routine) without abnormal findings: Secondary | ICD-10-CM

## 2021-06-21 NOTE — Telephone Encounter (Signed)
Last AEX 11/01/20. ? ?Rx'd for intermittent chronic vulvitis.  ?

## 2021-06-21 NOTE — Telephone Encounter (Signed)
Last mammo 11/30/20? Per pt--no report received from [facility].  ?

## 2021-06-21 NOTE — Telephone Encounter (Signed)
Please get a copy of patient's mammogram from [facility].  ?This will need to be within the last year for her to safely continue on the estrogen therapy. ?

## 2021-06-22 NOTE — Telephone Encounter (Signed)
Left detailed msg on VM per DPR.  ?

## 2021-06-27 ENCOUNTER — Other Ambulatory Visit: Payer: Self-pay | Admitting: Obstetrics and Gynecology

## 2021-06-27 MED ORDER — TRIAMCINOLONE ACETONIDE 0.025 % EX OINT: TOPICAL_OINTMENT | CUTANEOUS | 0 refills | Status: AC

## 2021-06-27 NOTE — Progress Notes (Signed)
Refill of triamcinolone.  ?

## 2022-01-23 ENCOUNTER — Ambulatory Visit (INDEPENDENT_AMBULATORY_CARE_PROVIDER_SITE_OTHER): Payer: Medicare HMO | Admitting: Radiology

## 2022-01-23 VITALS — BP 136/82

## 2022-01-23 DIAGNOSIS — N9489 Other specified conditions associated with female genital organs and menstrual cycle: Secondary | ICD-10-CM | POA: Diagnosis not present

## 2022-01-23 DIAGNOSIS — N958 Other specified menopausal and perimenopausal disorders: Secondary | ICD-10-CM | POA: Diagnosis not present

## 2022-01-23 DIAGNOSIS — R300 Dysuria: Secondary | ICD-10-CM | POA: Diagnosis not present

## 2022-01-23 LAB — URINALYSIS, COMPLETE W/RFL CULTURE
Bacteria, UA: NONE SEEN /HPF
Bilirubin Urine: NEGATIVE
Glucose, UA: NEGATIVE
Hgb urine dipstick: NEGATIVE
Hyaline Cast: NONE SEEN /LPF
Ketones, ur: NEGATIVE
Leukocyte Esterase: NEGATIVE
Nitrites, Initial: NEGATIVE
Protein, ur: NEGATIVE
RBC / HPF: NONE SEEN /HPF (ref 0–2)
Specific Gravity, Urine: 1.015 (ref 1.001–1.035)
WBC, UA: NONE SEEN /HPF (ref 0–5)
pH: 5.5 (ref 5.0–8.0)

## 2022-01-23 LAB — WET PREP FOR TRICH, YEAST, CLUE

## 2022-01-23 LAB — NO CULTURE INDICATED

## 2022-01-23 MED ORDER — ESTRADIOL 0.1 MG/GM VA CREA: TOPICAL_CREAM | VAGINAL | 1 refills | Status: AC

## 2022-01-23 NOTE — Progress Notes (Signed)
      Subjective: Diana Hurley is a 86 y.o. female who complains of burning with urination, no increased frequency or urgency, vulvar burning, no discharge. Symptoms intermittent x's 1 week. Tried AZO #1 tablet, cranberry, triamcinolone cream and estrogen cream (using once every 2 weeks), without relief.     Review of Systems  Past Medical History:  Diagnosis Date   Allergic rhinitis    Atrophic vaginitis    Bradycardia    Breast thickening    left   DDD (degenerative disc disease) 1998   Exacerbation in 2004 w/right radicular Sx   Diabetes mellitus, type 2 ([place]) 4888   Diastolic dysfunction    GRADE 1    Diverticulosis 2005   Found during colonoscopy   DJD (degenerative joint disease)    DOE (dyspnea on exertion) 03/01/12   Sx have increased over past several months, referral to [facility]   Elevated liver enzymes 2001   Secondary to a gallstone & fatty filtration of the liver   Essential hypertension, benign    Fatty infiltration of liver 2001   Gastritis    Hypercholesterolemia 2003   Hyperlipidemia    Hypertension    Lactose intolerance    Mitral regurgitation    Muscle cramps    In hands and thighs   Musculoskeletal chest pain    Osteopenia 2000   Osteoporosis 06/2015   Right Hip   Palpitations    recurrent   PSVT (paroxysmal supraventricular tachycardia) ([place]) 2011   PVC's (premature ventricular contractions) 2011   Shortness of breath    Sickle cell trait ([place])    Venous insufficiency 2009   Vertigo    Vitamin D deficiency 2010      Objective:  Today's Vitals   01/23/22 1334  BP: 136/82   There is no height or weight on file to calculate BMI.   Physical Exam Exam conducted with a chaperone present.  Constitutional:      Appearance: Normal appearance. She is normal weight.  Genitourinary:    General: Normal vulva.     Urethra: Urethral swelling present.     Vagina: Erythema present. No vaginal discharge, bleeding or lesions.   Neurological:     Mental Status: She is alert.  Psychiatric:        Mood and Affect: Mood normal.        Thought Content: Thought content normal.        Judgment: Judgment normal.      Urine dipstick shows negative for all components.  Micro exam: negative for WBC's or RBC's.  Microscopic wet-mount exam shows negative for pathogens, normal epithelial cells.   Chaperone offered and declined.  Assessment:/Plan:   1. Dysuria Reassured negative - Urinalysis,Complete w/RFL Culture  2. Vulvar burning Reassured negative - WET PREP FOR [place], YEAST, CLUE  3. Genitourinary syndrome of menopause  - estradiol (ESTRACE VAGINAL) 0.1 MG/GM vaginal cream; Place 1 gram vaginally at hs 2  times per week.  Dispense: 42.5 g; Refill: 1    [name], [facility]

## 2022-09-25 ENCOUNTER — Emergency Department (HOSPITAL_COMMUNITY): Payer: Medicare HMO

## 2022-09-25 ENCOUNTER — Encounter (HOSPITAL_COMMUNITY): Payer: Self-pay | Admitting: Emergency Medicine

## 2022-09-25 ENCOUNTER — Other Ambulatory Visit: Payer: Self-pay

## 2022-09-25 ENCOUNTER — Emergency Department (HOSPITAL_COMMUNITY)
Admission: EM | Admit: 2022-09-25 | Discharge: 2022-09-25 | Disposition: A | Discharge status: Home / Self Care | Payer: Medicare HMO | Attending: Emergency Medicine | Admitting: Emergency Medicine

## 2022-09-25 DIAGNOSIS — Z79899 Other long term (current) drug therapy: Secondary | ICD-10-CM | POA: Insufficient documentation

## 2022-09-25 DIAGNOSIS — R079 Chest pain, unspecified: Secondary | ICD-10-CM | POA: Diagnosis not present

## 2022-09-25 DIAGNOSIS — R0602 Shortness of breath: Secondary | ICD-10-CM | POA: Diagnosis present

## 2022-09-25 DIAGNOSIS — I10 Essential (primary) hypertension: Secondary | ICD-10-CM | POA: Diagnosis not present

## 2022-09-25 LAB — CBC
HCT: 42.4 % (ref 36.0–46.0)
Hemoglobin: 14.1 g/dL (ref 12.0–15.0)
MCH: 28 pg (ref 26.0–34.0)
MCHC: 33.3 g/dL (ref 30.0–36.0)
MCV: 84.3 fL (ref 80.0–100.0)
Platelets: 223 10*3/uL (ref 150–400)
RBC: 5.03 MIL/uL (ref 3.87–5.11)
RDW: 14.2 % (ref 11.5–15.5)
WBC: 7.3 10*3/uL (ref 4.0–10.5)
nRBC: 0 % (ref 0.0–0.2)

## 2022-09-25 LAB — BASIC METABOLIC PANEL WITH GFR
Anion gap: 11 (ref 5–15)
BUN: 14 mg/dL (ref 8–23)
CO2: 18 mmol/L — ABNORMAL LOW (ref 22–32)
Calcium: 9.1 mg/dL (ref 8.9–10.3)
Chloride: 109 mmol/L (ref 98–111)
Creatinine, Ser: 1.21 mg/dL — ABNORMAL HIGH (ref 0.44–1.00)
GFR, Estimated: 44 mL/min — ABNORMAL LOW
Glucose, Bld: 121 mg/dL — ABNORMAL HIGH (ref 70–99)
Potassium: 3.9 mmol/L (ref 3.5–5.1)
Sodium: 138 mmol/L (ref 135–145)

## 2022-09-25 LAB — TROPONIN I (HIGH SENSITIVITY): Troponin I (High Sensitivity): 6 ng/L (ref <18)

## 2022-09-25 MED ORDER — HYDRALAZINE HCL 20 MG/ML IJ SOLN
10.0000 mg | Freq: Once | INTRAMUSCULAR | Status: AC
Start: 2022-09-25 — End: 2022-09-25
  Administered 2022-09-25: 10 mg via INTRAVENOUS
  Filled 2022-09-25: qty 1

## 2022-09-25 MED ORDER — IRBESARTAN 75 MG PO TABS
75.0000 mg | ORAL_TABLET | Freq: Every day | ORAL | Status: DC
  Administered 2022-09-25: 75 mg via ORAL
  Filled 2022-09-25: qty 1

## 2022-09-25 NOTE — ED Provider Notes (Signed)
[place] EMERGENCY DEPARTMENT AT [facility] Provider Note   CSN: [id number] Arrival date & time: 09/25/22  4098     History  Chief Complaint  Patient presents with   Hypertension    Diana Hurley is a 87 y.o. female, history of hypertension, who presents to the ED secondary to elevated blood blood pressure, and shortness of breath, chest pain on exertion for the last couple days.  She states that her primary care doctor, changed her from amlodipine 10 mg to valsartan 80 mg, on July 31.  She states that she has been having some chest pain,/tightness, while walking, with elevated blood pressure.  Took her blood pressure today, and 200+. She states she does not have any headaches, nausea, vomiting, or abdominal pain.     Home Medications Prior to Admission medications   Medication Sig Start Date End Date Taking? Authorizing Provider  ezetimibe (ZETIA) 10 MG tablet Take 10 mg by mouth daily. 09/05/22  Yes [provider]  valsartan (DIOVAN) 80 MG tablet Take 80 mg by mouth every morning. 09/17/22  Yes [provider]  amLODipine (NORVASC) 5 MG tablet Take 5 mg by mouth daily.  07/06/14   [provider]  Calcium Carbonate-Vitamin D (CALCIUM 500 + D PO) Take 1 tablet by mouth daily.    [provider]  estradiol (ESTRACE VAGINAL) 0.1 MG/GM vaginal cream Place 1 gram vaginally at hs 2  times per week. 01/23/22   Chrzanowski, Jami B, NP  metoprolol succinate (TOPROL-XL) 25 MG 24 hr tablet Take 12.5 mg by mouth daily. 12/12/21   [provider]  Multiple Vitamins-Minerals (MULTIPLE VITAMINS/WOMENS PO) Take by mouth daily.    [provider]  pantoprazole (PROTONIX) 20 MG tablet Take 20 mg by mouth daily. 12/24/21   [provider]  rosuvastatin (CRESTOR) 5 MG tablet Take by mouth. Takes twice a week 09/13/19   [provider]  triamcinolone (KENALOG) 0.025 % ointment Apply to area twice daily for 2 weeks as  needed for irritation. 06/27/21   Patton Salles, MD      Allergies    Penicillins, Acarbose, Ace inhibitors, Fluvastatin, Food, Glipizide, Januvia [sitagliptin phosphate], Lovastatin, Metformin and related, Pravastatin, and Simvastatin    Review of Systems   Review of Systems  Respiratory:  Positive for chest tightness. Negative for shortness of breath.     Physical Exam Updated Vital Signs BP (!) 166/64 (BP Location: Right Arm)   Pulse 76   Temp 98.4 F (36.9 C) (Oral)   Resp 16   Ht 5\' 2"  (1.575 m)   Wt 62.6 kg   SpO2 100%   BMI 25.24 kg/m  Physical Exam Vitals and nursing note reviewed.  Constitutional:      General: She is not in acute distress.    Appearance: She is well-developed.  HENT:     Head: Normocephalic and atraumatic.  Eyes:     Conjunctiva/sclera: Conjunctivae normal.  Cardiovascular:     Rate and Rhythm: Normal rate and regular rhythm.     Heart sounds: No murmur heard. Pulmonary:     Effort: Pulmonary effort is normal. No respiratory distress.     Breath sounds: Normal breath sounds.  Abdominal:     Palpations: Abdomen is soft.     Tenderness: There is no abdominal tenderness.  Musculoskeletal:        General: No swelling.     Cervical back: Neck supple.  Skin:    General:  Skin is warm and dry.     Capillary Refill: Capillary refill takes less than 2 seconds.  Neurological:     Mental Status: She is alert.  Psychiatric:        Mood and Affect: Mood normal.     ED Results / Procedures / Treatments   Labs (all labs ordered are listed, but only abnormal results are displayed) Labs Reviewed  BASIC METABOLIC PANEL - Abnormal; Notable for the following components:      Result Value   CO2 18 (*)    Glucose, Bld 121 (*)    Creatinine, Ser 1.21 (*)    GFR, Estimated 44 (*)    All other components within normal limits  CBC  TROPONIN I (HIGH SENSITIVITY)    EKG None  Radiology DG Chest 2 View  Result Date: 09/25/2022 CLINICAL  DATA:  Hypertension EXAM: CHEST - 2 VIEW COMPARISON:  03/07/2021 FINDINGS: Lungs are clear.  No pleural effusion or pneumothorax. The heart is normal in size. Mild degenerative changes of the visualized thoracolumbar spine. Cholecystectomy clips. IMPRESSION: Normal chest radiographs. Electronically Signed   By: Charline Bills M.D.   On: 09/25/2022 20:44    Procedures Procedures    Medications Ordered in ED Medications  irbesartan (AVAPRO) tablet 75 mg (75 mg Oral Given 09/25/22 2119)  hydrALAZINE (APRESOLINE) injection 10 mg (10 mg Intravenous Given 09/25/22 2034)    ED Course/ Medical Decision Making/ A&P             HEART Score: 5                    Medical Decision Making Patient is a 87 year old female, here for elevated blood pressure, and intermittent chest pain.  Chest pain is worse on exertion.  She does have elevated blood pressure of 200+.  She states this is new since changing her medication to valsartan.  She called her doctor, they stated that they will change it in the morning, but since she was at the ER, to get further evaluated.  We will obtain troponins, chest x-ray, and give her hydralazine as well as irbesartan, as we do not have valsartan in the ER, for further care.  She has no neurodeficits on exam.  Amount and/or Complexity of Data Reviewed Labs: ordered.    Details: Troponin within normal limits Radiology: ordered.    Details: Chest x-ray clear ECG/medicine tests:  Decision-making details documented in ED Course. Discussion of management or test interpretation with external provider(s): Discussed with patient, she does have a heart score of 5, first troponin was within normal limits, there was a lab error, and lab was unable to run her troponin, she is requesting to go home, she is concerned about being able to drive home in the dark.  We discussed that I would like to have a second troponin, but she stated that she would feel more comfortable going home, we  discussed that is very important that if she has worsening chest pain, shortness of breath, to return to the ER immediately.  She voiced understanding.  Her blood pressure is improved, and she is going to follow-up with her PCP in the morning, and get her new medication.  She declined for me to send any new medication for blood pressure  Risk Prescription drug management.    Final Clinical Impression(s) / ED Diagnoses Final diagnoses:  Hypertension, unspecified type  Uncontrolled hypertension  Chest pain, unspecified type    Rx / DC Orders ED  Discharge Orders     None         Pete Pelt, [place] 09/25/22 2232    Tegeler, Canary Brim, MD 09/25/22 [phone]

## 2022-09-25 NOTE — ED Triage Notes (Signed)
Pt reports change to her BP meds last week.  Has been checking her pressure at home.  She reports BP higher in the 200s this evening.  Feels some palpitations as well.

## 2022-09-25 NOTE — Discharge Instructions (Signed)
Today your blood pressure was not high, we were able to get it down.  Please call your primary care doctor in the morning, and to have them send a new medication for you.  Return to the ER if you have chest pain, shortness of breath, or worsening symptoms.

## 2022-12-17 NOTE — Progress Notes (Unsigned)
Cardiology Office Note:  .   Date:  12/19/2022  ID:  Diana Hurley, DOB 07-09-1935, MRN [id number] PCP: Mosetta Putt, MD  [facility] Providers Cardiologist: Previously followed by Dr. Johney Frame for palpitations Bryan Lemma, MD     Chief Complaint  Patient presents with   New Patient (Initial Visit)    Chest Pressure & Shortness of Breath    Patient Profile: .     Diana Hurley is a  87 y.o. female  with a PMH notable for HTN/HLD/DM-2  who presents here for Exertional Chest Discomfort At the request of Mosetta Putt, MD.  Pertinent PMH:  Well-controlled DM-2 (A1c 5.8-on combination metformin, Januvia and glipizide),  HTN (intolerant of amlodipine with edema, angioedema from ACE inhibitors, and not on beta-blocker because of bradycardia),  Chronic dizziness associated bradycardia and orthostasis. 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    Diana Hurley was recently seen by Dr. Mosetta Putt following an ER visit on 09/25/2022 where she developed markedly elevated BP associated anxiety.  This precipitated a visit to [facility], ER.  She indicated having several weeks of chest tightness/pressure sometimes associated dyspnea occurring with walking but not at rest.  No dizziness diaphoresis or palpitations. Continued on low-dose Toprol.  Amlodipine discontinued due to pedal edema and started on valsartan 80 mg daily.  Plan for BMP recheck in 2 months.  Trying to avoid meds for DM.  Plans for weight loss => pt called in & was reverted back to amlodipine 10 mg & ARB stopped  Subjective  Discussed the use of AI scribe software for clinical note transcription with the patient, who gave verbal consent to proceed.  History of Present Illness   The patient, with a history of hypertension, type 2 diabetes, hyperlipidemia, chronic kidney  disease stage 3, chronic gastritis, and gastroesophageal reflux disease, presents with exertional chest discomfort. The patient reports a sensation of heaviness in the chest during physical activity, particularly walking. The discomfort is described as being located in the central chest area and typically resolves within a few seconds upon cessation of activity.  This is usually associated with dyspnea, but no diaphoresis.  The patient also reports occasional short-lived palpitations lasting for a few seconds. NO prolonged rapid or irregular heartbeats.  No syncope/near syncope or TIA/CVA/amaurosis fugax. No claudication.   The patient has been on amlodipine, rosuvastatin, Zetia, and Toprol for managing her chronic conditions. However, the patient reports experiencing dizziness after starting valsartan, leading to its discontinuation. The patient also takes Protonix, which has reportedly helped with her chronic gastritis symptoms.  The patient has a history of a nuclear stress test conducted in 2015 and a hospital visit for high blood pressure in August. The patient denies any symptoms of heart failure such as shortness of breath when lying flat or waking up in the middle of the night due to breathlessness. The patient also denies any symptoms suggestive of a neurological event such as sudden onset weakness, numbness on one side, slurred speech, or drooling.  The patient reports minimal leg swelling, which is more pronounced on the left side. The patient also experiences cramps at rest, which are severe in nature. The patient denies any blood in urine or stool, dark tarry stools, or recent illnesses, fevers, chills, or sweats. The patient also denies any pain in the calves or thighs  during walking.  The patient was previously active, walking at least three miles a day until advised to stop due to the chest discomfort. The patient's diabetes and cholesterol levels are reportedly well-controlled, with a recent  HbA1c of 6.4 and LDL of 79. The patient's blood pressure, however, tends to run high with systolic readings in the 140s and 150s.      Cardiovascular ROS: Cardiovascular ROS: positive for - chest pain, dyspnea on exertion, edema, palpitations, and dizziness - from allergies negative for - loss of consciousness, orthopnea, paroxysmal nocturnal dyspnea, rapid heart rate, shortness of breath, or syncope/near syncope, TIA/amaurosis fugax/CVA; claudication    ROS:  Review of Systems - Negative except cramping on occasion & allergies, off & on night sweats & hot flushes    Objective   PSH: 1982 TAH, 2006 lap chole, 2019 bilateral cataracts with lens implant. FH: Father died at 18 of liver cancer, mother died at 22 of renal failure related to DM and HTN, also had breast cancer.  Brother-79 with HTN and impaired glucose.  Sister 79 hypertension.  Sister 42 with CAD and HTN, sister died at 78 with CVA (had HTN and DM-2), another sister with HTN at 73.  Current Meds  Medication Sig   amLODipine (NORVASC) 10 MG tablet Take 10 mg by mouth daily.   Calcium Carbonate-Vitamin D (CALCIUM 500 + D PO) Take 1 tablet by mouth daily.   estradiol (ESTRACE VAGINAL) 0.1 MG/GM vaginal cream Place 1 gram vaginally at hs 2  times per week.   ezetimibe (ZETIA) 10 MG tablet Take 10 mg by mouth daily.   metoprolol succinate (TOPROL-XL) 25 MG 24 hr tablet Take 12.5 mg by mouth daily.   Multiple Vitamins-Minerals (MULTIPLE VITAMINS/WOMENS PO) Take by mouth daily.   pantoprazole (PROTONIX) 20 MG tablet Take 20 mg by mouth daily.   rosuvastatin (CRESTOR) 5 MG tablet Take by mouth. Takes twice a week   triamcinolone (KENALOG) 0.025 % ointment Apply to area twice daily for 2 weeks as needed for irritation.    Studies Reviewed: Marland Kitchen   EKG Interpretation Date/Time:  Wednesday December 18 2022 08:45:44 EDT Ventricular Rate:  55 PR Interval:  174 QRS Duration:  84 QT Interval:  444 QTC Calculation: 424 R Axis:   47  Text  Interpretation: Sinus bradycardia Nonspecific T wave abnormality When compared with ECG of 25-Sep-2022 18:28, Nonspecific T wave abnormality no longer evident in Lateral leads QT has shortened T wave inversion more pronounced in precordial leads Confirmed by Bryan Lemma ([id number]) on 12/18/2022 9:02:16 AM    EKG 09/25/2022 from PCPs office: Sinus bradycardia/sinus arrhythmia rate 54 bpm.  Subtle T wave inversions in inferior leads.  Borderline QT prolongation  Labs 09/06/2022: TC 151, TG 129, HDL 35, LDL 79; A1c 6.4, Hgb 13.6, GLU 104, K+ 4.1, Cr 1.12  Previous Cardiac Evaluations: ECHO 03/14/2013: Normal LVEF of 65 to 70%.  No RWMA.  GR 1 DD.  Mild MR. Myoview Stress Test 05/13/2013: No ischemia infarction MONITOR 04/28/2019: Sinus rhythm with frequent episodes of sinus bradycardia down to 36 bpm.  Rare PACs.  Risk Assessment/Calculations:     HYPERTENSION CONTROL Vitals:   12/18/22 0837 12/19/22 2013  BP: (!) 146/82 (!) 142/78    The patient's blood pressure is elevated above target today.  In order to address the patient's elevated BP: Blood pressure will be monitored at home to determine if medication changes need to be made. (Reassess blood pressures and follow-up visit after stress testing.  Addressed  at that time.)           Physical Exam:   VS:  BP (!) 142/78   Pulse (!) 55   Ht 5\' 2"  (1.575 m)   Wt 141 lb 12.8 oz (64.3 kg)   SpO2 98%   BMI 25.94 kg/m    Wt Readings from Last 3 Encounters:  12/18/22 141 lb 12.8 oz (64.3 kg)  09/25/22 138 lb (62.6 kg)  11/01/20 134 lb (60.8 kg)    GEN: Well nourished, well developed in no acute distress; Healthy-appearing.  Appears young for stated age. NECK: No JVD; No carotid bruits CARDIAC: Normal S1, S2; RRR, no murmurs, rubs, gallops RESPIRATORY:  Clear to auscultation without rales, wheezing or rhonchi ; nonlabored, good air movement. ABDOMEN: Soft, non-tender, non-distended EXTREMITIES:  No edema; No deformity      ASSESSMENT  AND PLAN: .    Problem List Items Addressed This Visit       Cardiology Problems   Essential hypertension, benign (Chronic)    Elevated blood pressure noted during visit. Patient reports systolic readings in the 140s-150s at home. Currently on Amlodipine 10mg  daily after discontinuing Valsartan due to dizziness. -Continue current antihypertensive regimen. => Would likely avoid pushing further based on her recent issues with orthostasis.      Relevant Medications   amLODipine (NORVASC) 10 MG tablet   Other Relevant Orders   EKG 12-Lead (Completed)   Hyperlipidemia with target low density lipoprotein (LDL) cholesterol less than 70 mg/dL (Chronic)    LDL slightly above target at 79. Patient on Rosuvastatin 5mg  twice weekly and Zetia 10mg  daily. -Continue current lipid-lowering regimen. Diabetes Mellitus Type 2 Well-controlled with recent A1c at 6.4. -Continue current management.      Relevant Medications   amLODipine (NORVASC) 10 MG tablet     Other   Bradycardia (Chronic)    Heart rate 55 with Toprol 12.5 mg daily.  Would not titrate further      Relevant Orders   EKG 12-Lead (Completed)   Chest pain with moderate risk for cardiac etiology - Primary    Exertional Chest Pain New onset, described as heaviness in the chest during exertion. No associated rest pain. Patient has multiple risk factors including hypertension, diabetes, and hyperlipidemia. -Order stress PET to evaluate for possible ischemia.      Relevant Orders   NM PET CT CARDIAC PERFUSION MULTI W/ABSOLUTE BLOODFLOW   CBC (Completed)   Cardiac Stress Test: Informed Consent Details: Physician/Practitioner Attestation; Transcribe to consent form and obtain patient signature   Chronic kidney disease, stage 3a (HCC) (Chronic)    Post recent creatinine was 1.12.  Would need to pay close attention to renal function if further evaluation after stress testing is warranted. -Continue current management.      DOE  (dyspnea on exertion)    Exertional dyspnea associated with chest discomfort.  Will plan to reevaluate with cardiac stress PET.  I am choosing stress PET because of ability to evaluate for both macrovascular and microvascular disease.      Relevant Orders   NM PET CT CARDIAC PERFUSION MULTI W/ABSOLUTE BLOODFLOW   CBC (Completed)   Cardiac Stress Test: Informed Consent Details: Physician/Practitioner Attestation; Transcribe to consent form and obtain patient signature        Informed Consent   Shared Decision Making/Informed Consent The risks [chest pain, shortness of breath, cardiac arrhythmias, dizziness, blood pressure fluctuations, myocardial infarction, stroke/transient ischemic attack, nausea, vomiting, allergic reaction, radiation exposure, metallic taste sensation and life-threatening  complications (estimated to be 1 in 10,000)], benefits (risk stratification, diagnosing coronary artery disease, treatment guidance) and alternatives of a cardiac PET stress test were discussed in detail with Ms. Rothlisberger and she agrees to proceed.      Follow-Up: Return in about 3 months (around 03/20/2023) for 2-3 month follow-up after study..  Total time spent: 33 min spent with patient + 31 min spent charting = 64 min      Signed, Marykay Lex, MD, MS Bryan Lemma, M.D., M.S. Interventional Cardiologist  [facility]  Pager # [phone] Phone # [phone] [address] [place], [place] [address]

## 2022-12-18 ENCOUNTER — Encounter: Payer: Self-pay | Admitting: Cardiology

## 2022-12-18 ENCOUNTER — Ambulatory Visit: Payer: Medicare HMO | Attending: Cardiology | Admitting: Cardiology

## 2022-12-18 VITALS — BP 142/78 | HR 55 | Ht 62.0 in | Wt 141.8 lb

## 2022-12-18 DIAGNOSIS — E785 Hyperlipidemia, unspecified: Secondary | ICD-10-CM | POA: Diagnosis not present

## 2022-12-18 DIAGNOSIS — R002 Palpitations: Secondary | ICD-10-CM

## 2022-12-18 DIAGNOSIS — R0602 Shortness of breath: Secondary | ICD-10-CM

## 2022-12-18 DIAGNOSIS — I10 Essential (primary) hypertension: Secondary | ICD-10-CM

## 2022-12-18 DIAGNOSIS — R001 Bradycardia, unspecified: Secondary | ICD-10-CM

## 2022-12-18 DIAGNOSIS — R079 Chest pain, unspecified: Secondary | ICD-10-CM

## 2022-12-18 DIAGNOSIS — R0609 Other forms of dyspnea: Secondary | ICD-10-CM | POA: Diagnosis not present

## 2022-12-18 DIAGNOSIS — N1831 Chronic kidney disease, stage 3a: Secondary | ICD-10-CM

## 2022-12-18 NOTE — Patient Instructions (Addendum)
Medication Instructions:   Not needed *If you need a refill on your cardiac medications before your next appointment, please call your pharmacy*   Lab Work: CBC If you have labs (blood work) drawn today and your tests are completely normal, you will receive your results only by: MyChart Message (if you have MyChart) OR A paper copy in the mail If you have any lab test that is abnormal or we need to change your treatment, we will call you to review the results.   Testing/Procedures: Will be schedule for PET scan at [facility] radiology  A cardiac PET stress test, also known as a myocardial perfusion scan, is a noninvasive procedure that measures blood flow to the heart muscle:  How it works A radioactive tracer is injected into a vein, and a PET scanner creates images of the heart. A medication is used to increase blood flow to the heart during the test.  What it can detect A cardiac PET stress test can help determine if the heart muscle is healthy, if there is damage or scar tissue, or if there is a buildup of abnormal substances. It can also detect disease in small blood vessels that are not visible on an angiogra  Follow-Up: At [facility], you and your health needs are our priority.  As part of our continuing mission to provide you with exceptional heart care, we have created designated Provider Care Teams.  These Care Teams include your primary Cardiologist (physician) and Advanced Practice Providers (APPs -  Physician Assistants and Nurse Practitioners) who all work together to provide you with the care you need, when you need it.     Your next appointment:   2 to 3  month(s)  The format for your next appointment:   In Person  Provider:   Bryan Lemma, MD    Other Instructions   How to Prepare for Your Cardiac PET/CT Stress Test:  1. Please do not take these medications before your test:   Medications that may interfere with the cardiac pharmacological stress  agent (ex. nitrates - including erectile dysfunction medications, isosorbide mononitrate- [please start to hold this medication the day before the test], tamulosin or beta-blockers) the day of the exam. (Erectile dysfunction medication should be held for at least 72 hrs prior to test) Theophylline containing medications for 12 hours. Dipyridamole 48 hours prior to the test. Your remaining medications may be taken with water.  2. Nothing to eat or drink, except water, 3 hours prior to arrival time.   NO caffeine/decaffeinated products, or chocolate 12 hours prior to arrival.  3. NO perfume, cologne or lotion on chest or abdomen area.          - FEMALES - Please avoid wearing dresses to this appointment.  4. Total time is 1 to 2 hours; you may want to bring reading material for the waiting time.  5. Please report to Radiology at the [facility] Main Entrance 30 minutes early for your test.  [address] [place], [place] [address]      IF YOU THINK YOU MAY BE PREGNANT, OR ARE NURSING PLEASE INFORM THE TECHNOLOGIST.  In preparation for your appointment, medication and supplies will be purchased.  Appointment availability is limited, so if you need to cancel or reschedule, please call the Radiology Department at [phone] [facility]) OR [phone] [facility])  24 hours in advance to avoid a cancellation fee of $100.00  What to Expect After you Arrive:  Once you  arrive and check in for your appointment, you will be taken to a preparation room within the Radiology Department.  A technologist or Nurse will obtain your medical history, verify that you are correctly prepped for the exam, and explain the procedure.  Afterwards,  an IV will be started in your arm and electrodes will be placed on your skin for EKG monitoring during the stress portion of the exam. Then you will be escorted to the PET/CT scanner.  There, staff will get you positioned on the scanner and obtain a blood  pressure and EKG.  During the exam, you will continue to be connected to the EKG and blood pressure machines.  A small, safe amount of a radioactive tracer will be injected in your IV to obtain a series of pictures of your heart along with an injection of a stress agent.    After your Exam:  It is recommended that you eat a meal and drink a caffeinated beverage to counter act any effects of the stress agent.  Drink plenty of fluids for the remainder of the day and urinate frequently for the first couple of hours after the exam.  Your doctor will inform you of your test results within 7-10 business days.  For more information and frequently asked questions, please visit our website : [email or web address]  For questions about your test or how to prepare for your test, please call: Cardiac Imaging Nurse Navigators Office: [phone]

## 2022-12-19 ENCOUNTER — Encounter: Payer: Self-pay | Admitting: Cardiology

## 2022-12-19 DIAGNOSIS — E785 Hyperlipidemia, unspecified: Secondary | ICD-10-CM | POA: Insufficient documentation

## 2022-12-19 DIAGNOSIS — N1831 Chronic kidney disease, stage 3a: Secondary | ICD-10-CM | POA: Insufficient documentation

## 2022-12-19 LAB — CBC
Hematocrit: 46.1 % (ref 34.0–46.6)
Hemoglobin: 15.1 g/dL (ref 11.1–15.9)
MCH: 28.4 pg (ref 26.6–33.0)
MCHC: 32.8 g/dL (ref 31.5–35.7)
MCV: 87 fL (ref 79–97)
Platelets: 238 10*3/uL (ref 150–450)
RBC: 5.31 x10E6/uL — ABNORMAL HIGH (ref 3.77–5.28)
RDW: 14.2 % (ref 11.7–15.4)
WBC: 6.6 10*3/uL (ref 3.4–10.8)

## 2022-12-19 NOTE — Assessment & Plan Note (Signed)
Post recent creatinine was 1.12.  Would need to pay close attention to renal function if further evaluation after stress testing is warranted. -Continue current management.

## 2022-12-19 NOTE — Assessment & Plan Note (Signed)
LDL slightly above target at 79. Patient on Rosuvastatin 5mg  twice weekly and Zetia 10mg  daily. -Continue current lipid-lowering regimen. Diabetes Mellitus Type 2 Well-controlled with recent A1c at 6.4. -Continue current management.

## 2022-12-19 NOTE — Assessment & Plan Note (Signed)
Exertional dyspnea associated with chest discomfort.  Will plan to reevaluate with cardiac stress PET.  I am choosing stress PET because of ability to evaluate for both macrovascular and microvascular disease.

## 2022-12-19 NOTE — Assessment & Plan Note (Signed)
Exertional Chest Pain New onset, described as heaviness in the chest during exertion. No associated rest pain. Patient has multiple risk factors including hypertension, diabetes, and hyperlipidemia. -Order stress PET to evaluate for possible ischemia.

## 2022-12-19 NOTE — Assessment & Plan Note (Addendum)
Elevated blood pressure noted during visit. Patient reports systolic readings in the 140s-150s at home. Currently on Amlodipine 10mg  daily after discontinuing Valsartan due to dizziness. -Continue current antihypertensive regimen. => Would likely avoid pushing further based on her recent issues with orthostasis.

## 2022-12-19 NOTE — Assessment & Plan Note (Signed)
Heart rate 55 with Toprol 12.5 mg daily.  Would not titrate further

## 2022-12-26 ENCOUNTER — Encounter (HOSPITAL_COMMUNITY): Payer: Self-pay

## 2022-12-27 ENCOUNTER — Telehealth (HOSPITAL_COMMUNITY): Payer: Self-pay | Admitting: Emergency Medicine

## 2022-12-27 NOTE — Telephone Encounter (Signed)
Reaching out to patient to offer assistance regarding upcoming cardiac imaging study; pt verbalizes understanding of appt date/time, parking situation and where to check in, pre-test NPO status and medications ordered, and verified current allergies; name and call back number provided for further questions should they arise Cayne Yom RN Navigator Cardiac Imaging [facility] [phone] office [phone] cell 

## 2022-12-31 ENCOUNTER — Ambulatory Visit (HOSPITAL_COMMUNITY)
Admission: RE | Admit: 2022-12-31 | Discharge: 2022-12-31 | Disposition: A | Discharge status: Home / Self Care | Payer: Medicare HMO | Source: Ambulatory Visit | Attending: Cardiology | Admitting: Cardiology

## 2022-12-31 DIAGNOSIS — R079 Chest pain, unspecified: Secondary | ICD-10-CM | POA: Diagnosis not present

## 2022-12-31 DIAGNOSIS — R0609 Other forms of dyspnea: Secondary | ICD-10-CM | POA: Insufficient documentation

## 2022-12-31 DIAGNOSIS — R0602 Shortness of breath: Secondary | ICD-10-CM | POA: Diagnosis present

## 2022-12-31 LAB — NM PET CT CARDIAC PERFUSION MULTI W/ABSOLUTE BLOODFLOW
LV dias vol: 61 mL (ref 46–106)
MBFR: 2.54
Nuc Rest EF: 62 %
Nuc Stress EF: 77 %
Peak HR: 82 {beats}/min
Rest HR: 62 {beats}/min
Rest MBF: 1.04 ml/g/min
Rest Nuclear Isotope Dose: 16.6 mCi
Rest perfusion cavity size (mL): 61 mL
ST Depression (mm): 0 mm
Stress MBF: 2.64 ml/g/min
Stress Nuclear Isotope Dose: 16.5 mCi
Stress perfusion cavity size (mL): 61 mL
TID: 0.91

## 2022-12-31 MED ORDER — RUBIDIUM RB82 GENERATOR (RUBYFILL)
16.9200 | PACK | Freq: Once | INTRAVENOUS | Status: AC
  Administered 2022-12-31: 16.54 via INTRAVENOUS

## 2022-12-31 MED ORDER — REGADENOSON 0.4 MG/5ML IV SOLN
INTRAVENOUS | Status: AC
  Filled 2022-12-31: qty 5

## 2022-12-31 MED ORDER — REGADENOSON 0.4 MG/5ML IV SOLN
0.4000 mg | Freq: Once | INTRAVENOUS | Status: AC
  Administered 2022-12-31: 0.4 mg via INTRAVENOUS

## 2022-12-31 MED ORDER — RUBIDIUM RB82 GENERATOR (RUBYFILL)
16.9200 | PACK | Freq: Once | INTRAVENOUS | Status: AC
  Administered 2022-12-31: 16.62 via INTRAVENOUS

## 2022-12-31 NOTE — Progress Notes (Signed)
Some stomach cramping.  Otherwise tolerated test well

## 2023-02-28 ENCOUNTER — Ambulatory Visit: Payer: Medicare HMO | Attending: Cardiology | Admitting: Cardiology

## 2023-02-28 ENCOUNTER — Encounter: Payer: Self-pay | Admitting: Cardiology

## 2023-02-28 VITALS — BP 150/62 | HR 60 | Ht 62.0 in | Wt 139.4 lb

## 2023-02-28 DIAGNOSIS — R079 Chest pain, unspecified: Secondary | ICD-10-CM

## 2023-02-28 DIAGNOSIS — R600 Localized edema: Secondary | ICD-10-CM | POA: Diagnosis not present

## 2023-02-28 DIAGNOSIS — I10 Essential (primary) hypertension: Secondary | ICD-10-CM

## 2023-02-28 NOTE — Patient Instructions (Signed)
 Medication Instructions:  NO changes   Follow-Up: At [facility], you and your health needs are our priority.  As part of our continuing mission to provide you with exceptional heart care, we have created designated Provider Care Teams.  These Care Teams include your primary Cardiologist (physician) and Advanced Practice Providers (APPs -  Physician Assistants and Nurse Practitioners) who all work together to provide you with the care you need, when you need it.    Your next appointment:   As needed  Provider:   Alm Clay, MD

## 2023-02-28 NOTE — Progress Notes (Signed)
 Cardiology Office Note:  .   Date:  03/06/2023  ID:  [place] Diana, DOB 06/28/1935, MRN [id number] PCP: Windy Coy, MD  [facility] Providers Cardiologist:  Alm Clay, MD     Chief Complaint  Patient presents with   Follow-up    Discuss test results-stress test evaluation of chest pain   Patient Profile: .     Diana [place] is a very pleasant 88 y.o. female with a pertinent PMH noted below who presents here for follow-up to discuss test results-initially seen For Evaluation of Exertional Chest Discomfort at the request of Windy Coy, MD.  Pertinent PMH:  Well-controlled DM-2 (A1c 5.8-on combination metformin, Januvia and glipizide),  HTN (intolerant of amlodipine with edema, angioedema from ACE inhibitors, and not on beta-blocker because of bradycardia),  Chronic dizziness associated bradycardia and orthostasis. 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    Diana [place] was last seen on 12/18/2022 for evaluation of chest discomfort that was exertional in nature, and concerning for possible angina.  Occurred with exertion resolved with resting.  Also noted exertional dyspnea and mild edema but no PND orthopnea.  Subjective  Discussed the use of AI scribe software for clinical note transcription with the patient, who gave verbal consent to proceed.  History of Present Illness   The patient, with a history of cardiovascular disease, presented for a follow-up visit after a recent stress test. She reported no recent episodes of chest pain, which was the initial concern leading to the stress test. The patient also mentioned occasional dizziness when lying down, but no other symptoms such as heart racing, skipping, or flipping. She also reported some swelling in the ankles, which was thought to be related to her medication,  amlodipine. The swelling reportedly subsides by morning and is not associated with shortness of breath. The patient also raised a concern about a slightly elevated red blood cell count noted on her chart, but did not report any symptoms related to this finding.      Cardiovascular ROS: positive for - irregular heartbeat, palpitations, and Mild end of day edema negative for - chest pain, dyspnea on exertion, loss of consciousness, orthopnea, paroxysmal nocturnal dyspnea, rapid heart rate, shortness of breath, or syncope or near-syncope, TIA or amaurosis fugax, claudication  ROS:  Review of Systems - Negative except symptoms noted above    Objective   Current Meds  Medication Sig   ACCU-CHEK SMARTVIEW test strip 1 each by Other route as needed.   amLODipine (NORVASC) 10 MG tablet Take 10 mg by mouth daily.   Calcium Carbonate-Vitamin D (CALCIUM 500 + D PO) Take 1 tablet by mouth daily.   estradiol  (ESTRACE  VAGINAL) 0.1 MG/GM vaginal cream Place 1 gram vaginally at hs 2  times per week.   ezetimibe (ZETIA) 10 MG tablet Take 10 mg by mouth daily.   metoprolol succinate (TOPROL-XL) 25 MG 24 hr tablet Take 12.5 mg by mouth daily.   Multiple Vitamins-Minerals (MULTIPLE VITAMINS/WOMENS PO) Take by mouth daily.   pantoprazole  (PROTONIX ) 20 MG tablet Take 20 mg by mouth daily.   rosuvastatin (CRESTOR) 5 MG tablet Take by mouth. Takes twice a week   triamcinolone  (KENALOG ) 0.025 % ointment Apply to area twice daily for 2 weeks as needed for irritation.    Studies Reviewed: .  Cardiac Stress PET 12/31/2022: No acute lung findings hepatic steatosis noted along cardiomegaly.  Normal LV perfusion with no evidence of ischemia or infarction.  Rest EF 62% that improved to stress EF of 77%.  LOW RISK  Risk Assessment/Calculations:          Physical Exam:   VS:  BP (!) 150/62 (BP Location: Left Arm, Patient Position: Sitting, Cuff Size: Normal)   Pulse 60   Ht 5' 2 (1.575 m)   Wt 139 lb 6.4  oz (63.2 kg)   SpO2 97%   BMI 25.50 kg/m    Wt Readings from Last 3 Encounters:  02/28/23 139 lb 6.4 oz (63.2 kg)  12/18/22 141 lb 12.8 oz (64.3 kg)  09/25/22 138 lb (62.6 kg)    GEN: Well nourished, well developed in no acute distress; well-groomed and healthy-appearing. NECK: No JVD; No carotid bruits CARDIAC: Normal S1, S2; RRR, no murmurs, rubs, gallops RESPIRATORY:  Clear to auscultation without rales, wheezing or rhonchi ; nonlabored, good air movement. ABDOMEN: Soft, non-tender, non-distended EXTREMITIES:  No edema; No deformity    ASSESSMENT AND PLAN: .    Problem List Items Addressed This Visit       Cardiology Problems   Essential hypertension, benign (Chronic)   Blood pressure slightly elevated at the current visit, but patient is asymptomatic and has a history of similar readings. Patient is currently on Metoprolol and Amlodipine. -Continue current medications.        Other   Chest pain with moderate risk for cardiac etiology - Primary   Exertional Chest Pain No recent episodes. Stress test showed normal pump function at rest and during exertion, with all heart walls moving well. The previous episode of chest pain was not related to decreased blood flow to the heart. -No changes to current management plan.      Peripheral edema   Ankle swelling noted, likely secondary to Amlodipine. Swelling resolves by morning and patient is not short of breath. -Continue current medications.      Elevated Red Blood Cell Count Slightly above the upper limit of normal, but hemoglobin is within normal range. No associated symptoms or signs of anemia. -No changes to current management plan.  Follow-up As needed. Patient is encouraged to contact the office if any new symptoms arise.  - Return if symptoms worsen or fail to improve.    Signed, Alm MICAEL Clay, MD, MS Alm Clay, M.D., M.S. Interventional Cardiologist  [facility]  Pager #  [phone] Phone # [phone] [address] [place], [place] [address]

## 2023-03-06 ENCOUNTER — Encounter: Payer: Self-pay | Admitting: Cardiology

## 2023-03-06 DIAGNOSIS — R600 Localized edema: Secondary | ICD-10-CM | POA: Insufficient documentation

## 2023-03-06 NOTE — Assessment & Plan Note (Signed)
Exertional Chest Pain No recent episodes. Stress test showed normal pump function at rest and during exertion, with all heart walls moving well. The previous episode of chest pain was not related to decreased blood flow to the heart. -No changes to current management plan.

## 2023-03-06 NOTE — Assessment & Plan Note (Signed)
Ankle swelling noted, likely secondary to Amlodipine. Swelling resolves by morning and patient is not short of breath. -Continue current medications.

## 2023-03-06 NOTE — Assessment & Plan Note (Signed)
Blood pressure slightly elevated at the current visit, but patient is asymptomatic and has a history of similar readings. Patient is currently on Metoprolol and Amlodipine. -Continue current medications.

## 2023-03-07 ENCOUNTER — Ambulatory Visit
Admission: RE | Admit: 2023-03-07 | Discharge: 2023-03-07 | Disposition: A | Discharge status: Home / Self Care | Payer: Medicare HMO | Source: Ambulatory Visit | Attending: Family Medicine | Admitting: Family Medicine

## 2023-03-07 ENCOUNTER — Other Ambulatory Visit: Payer: Self-pay | Admitting: Family Medicine

## 2023-03-07 DIAGNOSIS — M79672 Pain in left foot: Secondary | ICD-10-CM
